# Patient Record
Sex: Female | Born: 1970
Health system: Southern US, Community
[De-identification: ages and names within clinical notes are randomized; demographics above are authoritative.]

## PROBLEM LIST (undated history)

## (undated) DIAGNOSIS — N926 Irregular menstruation, unspecified: Secondary | ICD-10-CM

## (undated) DIAGNOSIS — M549 Dorsalgia, unspecified: Secondary | ICD-10-CM

## (undated) DIAGNOSIS — E785 Hyperlipidemia, unspecified: Secondary | ICD-10-CM

## (undated) DIAGNOSIS — M1990 Unspecified osteoarthritis, unspecified site: Secondary | ICD-10-CM

## (undated) DIAGNOSIS — L74510 Primary focal hyperhidrosis, axilla: Secondary | ICD-10-CM

## (undated) DIAGNOSIS — D649 Anemia, unspecified: Secondary | ICD-10-CM

## (undated) DIAGNOSIS — G8929 Other chronic pain: Secondary | ICD-10-CM

## (undated) HISTORY — DX: Irregular menstruation, unspecified: N92.6

## (undated) HISTORY — PX: CHOLECYSTECTOMY: SHX55

## (undated) HISTORY — PX: TUBAL LIGATION: SHX77

## (undated) HISTORY — DX: Primary focal hyperhidrosis, axilla: L74.510

---
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
## 2002-04-19 ENCOUNTER — Emergency Department (HOSPITAL_COMMUNITY): Admission: EM | Admit: 2002-04-19 | Discharge: 2002-04-19 | Payer: Self-pay | Admitting: Emergency Medicine

## 2002-09-19 ENCOUNTER — Encounter: Payer: Self-pay | Admitting: Obstetrics and Gynecology

## 2002-09-19 ENCOUNTER — Ambulatory Visit (HOSPITAL_COMMUNITY): Admission: RE | Admit: 2002-09-19 | Discharge: 2002-09-19 | Payer: Self-pay | Admitting: Obstetrics and Gynecology

## 2003-03-09 ENCOUNTER — Inpatient Hospital Stay (HOSPITAL_COMMUNITY): Admission: AD | Admit: 2003-03-09 | Discharge: 2003-03-10 | Payer: Self-pay | Admitting: Family Medicine

## 2003-03-10 ENCOUNTER — Encounter: Payer: Self-pay | Admitting: Family Medicine

## 2004-01-29 ENCOUNTER — Ambulatory Visit (HOSPITAL_COMMUNITY): Admission: RE | Admit: 2004-01-29 | Discharge: 2004-01-29 | Payer: Self-pay | Admitting: Internal Medicine

## 2004-04-27 ENCOUNTER — Ambulatory Visit (HOSPITAL_COMMUNITY): Admission: RE | Admit: 2004-04-27 | Discharge: 2004-04-27 | Payer: Self-pay | Admitting: Family Medicine

## 2005-12-08 ENCOUNTER — Ambulatory Visit (HOSPITAL_COMMUNITY): Admission: RE | Admit: 2005-12-08 | Discharge: 2005-12-08 | Payer: Self-pay | Admitting: General Surgery

## 2006-09-19 ENCOUNTER — Emergency Department (HOSPITAL_COMMUNITY): Admission: EM | Admit: 2006-09-19 | Discharge: 2006-09-19 | Payer: Self-pay | Admitting: Emergency Medicine

## 2010-03-13 ENCOUNTER — Emergency Department (HOSPITAL_COMMUNITY): Admission: EM | Admit: 2010-03-13 | Discharge: 2010-03-13 | Payer: Self-pay | Admitting: Emergency Medicine

## 2010-03-14 ENCOUNTER — Ambulatory Visit (HOSPITAL_COMMUNITY): Admission: RE | Admit: 2010-03-14 | Discharge: 2010-03-14 | Payer: Self-pay | Admitting: Emergency Medicine

## 2010-07-27 LAB — URINALYSIS, ROUTINE W REFLEX MICROSCOPIC
Bilirubin Urine: NEGATIVE
Glucose, UA: NEGATIVE mg/dL
Hgb urine dipstick: NEGATIVE
Ketones, ur: NEGATIVE mg/dL
Nitrite: NEGATIVE
Protein, ur: NEGATIVE mg/dL
Specific Gravity, Urine: 1.02 (ref 1.005–1.030)
Urobilinogen, UA: 0.2 mg/dL (ref 0.0–1.0)
pH: 7.5 (ref 5.0–8.0)

## 2010-09-30 NOTE — H&P (Signed)
Raven Smith, Raven Smith               ACCOUNT NO.:  [id number]   MEDICAL RECORD NO.:  [id number]          PATIENT TYPE:  AMB   LOCATION:  DAY                           FACILITY:  [facility]   PHYSICIAN:  Dalia Heading, M.D.  DATE OF BIRTH:  04-Nov-1970   DATE OF ADMISSION:  DATE OF DISCHARGE:  LH                                HISTORY & PHYSICAL   CHIEF COMPLAINT:  Laceration, left ear lobe.   HISTORY OF PRESENT ILLNESS:  The patient is a 40 year old black female who  is referred for evaluation and treatment of a laceration in the left ear  lobe, an ear ring pulled through in the past and it has healed irregularly.  It is still separated.   PAST MEDICAL HISTORY:  Unremarkable.   PAST SURGICAL HISTORY:  Cholecystectomy, tubal ligation.   CURRENT MEDICATIONS:  None.   ALLERGIES:  No known drug allergies.   REVIEW OF SYSTEMS:  Noncontributory.   PHYSICAL EXAMINATION:  GENERAL:  The patient is a well-developed, well-  nourished, black female in no acute distress.  LUNGS:  Clear to auscultation  with equal breath sounds bilaterally.  HEART:  Regular rate and rhythm without S3, S4, or murmurs.  HEENT:  A torn left ear lobe.   IMPRESSION:  Laceration, left ear lobe.   PLAN:  The patient is scheduled for repair of the laceration, left ear lobe,  on December 06, 2005.  Risks and benefits of the procedure including bleeding  and infection were fully explained to the patient, gave informed consent.      Dalia Heading, M.D.  Electronically Signed    MAJ/MEDQ  D:  11/30/2005  T:  11/30/2005  Job:  [id number]   cc:   Mila Homer. Sudie Bailey, M.D.  Fax: [phone]

## 2010-09-30 NOTE — Op Note (Signed)
NAMEJYLL, TOMARO               ACCOUNT NO.:  [id number]   MEDICAL RECORD NO.:  [id number]          PATIENT TYPE:  AMB   LOCATION:  DAY                           FACILITY:  [facility]   PHYSICIAN:  Dalia Heading, M.D.  DATE OF BIRTH:  [birth year]   DATE OF PROCEDURE:  12/08/2005  DATE OF DISCHARGE:                                 OPERATIVE REPORT   PREOPERATIVE DIAGNOSIS:  Laceration, left ear.   POSTOPERATIVE DIAGNOSIS:  Laceration, left ear.   PROCEDURE:  Repair of laceration, left ear,   SURGEON:  Dalia Heading, M.D.   ANESTHESIA:  MAC.   INDICATIONS:  The patient is a 40 year old black female who suffered a  laceration to the left earlobe.  It has healed irregularly and is still  separated.  The patient now comes to the operating room for repair of the  laceration of the left ear lobe.  The risks and benefits of the procedure  were fully explained to the patient, who gave informed consent.   PROCEDURE NOTE:  The patient was placed in a supine position.  The left ear  was prepped and draped using the usual sterile technique with Betadine.  Surgical site confirmation was performed.   A full-thickness healed tear was noted in the left earlobe.  The inside  edges of this were excised.  Any bleeding was controlled using Bovie  electrocautery.  The edges were then reapproximated circumferentially using  a 5-0 nylon interrupted suture.  Xylocaine 1% was used throughout the  procedure for anesthesia.  Neosporin ointment was then applied.   All tape and counts were correct at the end of the procedure.  The patient  was administered to PACU in stable condition.   COMPLICATIONS:  None.   SPECIMEN:  None.   BLOOD LOSS:  Minimal.      Dalia Heading, M.D.  Electronically Signed     MAJ/MEDQ  D:  12/08/2005  T:  12/08/2005  Job:  [id number]   cc:   Mila Homer. Sudie Bailey, M.D.  Fax: [phone]

## 2010-12-15 DIAGNOSIS — Z01419 Encounter for gynecological examination (general) (routine) without abnormal findings: Secondary | ICD-10-CM | POA: Insufficient documentation

## 2010-12-15 DIAGNOSIS — Z113 Encounter for screening for infections with a predominantly sexual mode of transmission: Secondary | ICD-10-CM | POA: Insufficient documentation

## 2010-12-19 ENCOUNTER — Other Ambulatory Visit (HOSPITAL_COMMUNITY)
Admission: RE | Admit: 2010-12-19 | Discharge: 2010-12-19 | Disposition: A | Discharge status: Home / Self Care | Payer: Medicaid Other | Source: Ambulatory Visit | Attending: Obstetrics and Gynecology | Admitting: Obstetrics and Gynecology

## 2011-01-24 ENCOUNTER — Other Ambulatory Visit (HOSPITAL_COMMUNITY): Payer: Self-pay | Admitting: Family Medicine

## 2011-01-24 DIAGNOSIS — E049 Nontoxic goiter, unspecified: Secondary | ICD-10-CM

## 2011-01-24 DIAGNOSIS — R609 Edema, unspecified: Secondary | ICD-10-CM

## 2011-01-27 ENCOUNTER — Ambulatory Visit (HOSPITAL_COMMUNITY)
Admission: RE | Admit: 2011-01-27 | Discharge: 2011-01-27 | Disposition: A | Discharge status: Home / Self Care | Payer: Medicaid Other | Source: Ambulatory Visit | Attending: Family Medicine | Admitting: Family Medicine

## 2011-01-27 ENCOUNTER — Other Ambulatory Visit (HOSPITAL_COMMUNITY): Payer: Self-pay | Admitting: Family Medicine

## 2011-01-27 DIAGNOSIS — R609 Edema, unspecified: Secondary | ICD-10-CM

## 2011-01-27 DIAGNOSIS — E042 Nontoxic multinodular goiter: Secondary | ICD-10-CM | POA: Insufficient documentation

## 2011-01-27 DIAGNOSIS — M545 Low back pain, unspecified: Secondary | ICD-10-CM

## 2011-01-27 DIAGNOSIS — E049 Nontoxic goiter, unspecified: Secondary | ICD-10-CM

## 2011-03-13 ENCOUNTER — Other Ambulatory Visit (HOSPITAL_COMMUNITY): Payer: Self-pay | Admitting: Family Medicine

## 2011-03-13 DIAGNOSIS — R200 Anesthesia of skin: Secondary | ICD-10-CM

## 2011-03-17 ENCOUNTER — Other Ambulatory Visit: Payer: Self-pay | Admitting: Neurology

## 2011-03-17 DIAGNOSIS — R200 Anesthesia of skin: Secondary | ICD-10-CM

## 2011-03-20 ENCOUNTER — Ambulatory Visit (HOSPITAL_COMMUNITY): Payer: Medicaid Other

## 2011-03-30 ENCOUNTER — Ambulatory Visit (HOSPITAL_COMMUNITY): Payer: Medicaid Other

## 2011-03-31 ENCOUNTER — Other Ambulatory Visit (HOSPITAL_COMMUNITY): Payer: Medicaid Other

## 2011-04-10 ENCOUNTER — Ambulatory Visit (HOSPITAL_COMMUNITY): Admission: RE | Admit: 2011-04-10 | Payer: Medicaid Other | Source: Ambulatory Visit

## 2011-04-10 ENCOUNTER — Ambulatory Visit (HOSPITAL_COMMUNITY)
Admission: RE | Admit: 2011-04-10 | Discharge: 2011-04-10 | Disposition: A | Discharge status: Home / Self Care | Payer: Medicaid Other | Source: Ambulatory Visit | Attending: Neurology | Admitting: Neurology

## 2011-04-10 DIAGNOSIS — R209 Unspecified disturbances of skin sensation: Secondary | ICD-10-CM | POA: Insufficient documentation

## 2011-04-10 DIAGNOSIS — R200 Anesthesia of skin: Secondary | ICD-10-CM

## 2011-04-10 DIAGNOSIS — M51379 Other intervertebral disc degeneration, lumbosacral region without mention of lumbar back pain or lower extremity pain: Secondary | ICD-10-CM | POA: Insufficient documentation

## 2011-04-10 DIAGNOSIS — M542 Cervicalgia: Secondary | ICD-10-CM | POA: Insufficient documentation

## 2011-04-10 DIAGNOSIS — M5137 Other intervertebral disc degeneration, lumbosacral region: Secondary | ICD-10-CM | POA: Insufficient documentation

## 2011-04-10 DIAGNOSIS — M5126 Other intervertebral disc displacement, lumbar region: Secondary | ICD-10-CM | POA: Insufficient documentation

## 2012-05-15 DIAGNOSIS — E785 Hyperlipidemia, unspecified: Secondary | ICD-10-CM

## 2012-05-15 HISTORY — DX: Hyperlipidemia, unspecified: E78.5

## 2013-01-23 ENCOUNTER — Emergency Department (HOSPITAL_COMMUNITY): Payer: Medicaid Other

## 2013-01-23 ENCOUNTER — Emergency Department (HOSPITAL_COMMUNITY)
Admission: EM | Admit: 2013-01-23 | Discharge: 2013-01-23 | Disposition: A | Discharge status: Home / Self Care | Payer: Medicaid Other | Attending: Emergency Medicine | Admitting: Emergency Medicine

## 2013-01-23 ENCOUNTER — Encounter (HOSPITAL_COMMUNITY): Payer: Self-pay | Admitting: Emergency Medicine

## 2013-01-23 DIAGNOSIS — R1013 Epigastric pain: Secondary | ICD-10-CM | POA: Insufficient documentation

## 2013-01-23 DIAGNOSIS — Z79899 Other long term (current) drug therapy: Secondary | ICD-10-CM | POA: Insufficient documentation

## 2013-01-23 DIAGNOSIS — R0602 Shortness of breath: Secondary | ICD-10-CM | POA: Insufficient documentation

## 2013-01-23 DIAGNOSIS — M549 Dorsalgia, unspecified: Secondary | ICD-10-CM

## 2013-01-23 DIAGNOSIS — Z791 Long term (current) use of non-steroidal anti-inflammatories (NSAID): Secondary | ICD-10-CM | POA: Insufficient documentation

## 2013-01-23 DIAGNOSIS — Z9089 Acquired absence of other organs: Secondary | ICD-10-CM | POA: Insufficient documentation

## 2013-01-23 DIAGNOSIS — M129 Arthropathy, unspecified: Secondary | ICD-10-CM | POA: Insufficient documentation

## 2013-01-23 DIAGNOSIS — R42 Dizziness and giddiness: Secondary | ICD-10-CM | POA: Insufficient documentation

## 2013-01-23 DIAGNOSIS — G8929 Other chronic pain: Secondary | ICD-10-CM | POA: Insufficient documentation

## 2013-01-23 DIAGNOSIS — Z9851 Tubal ligation status: Secondary | ICD-10-CM | POA: Insufficient documentation

## 2013-01-23 DIAGNOSIS — R071 Chest pain on breathing: Secondary | ICD-10-CM | POA: Insufficient documentation

## 2013-01-23 DIAGNOSIS — R0789 Other chest pain: Secondary | ICD-10-CM

## 2013-01-23 HISTORY — DX: Dorsalgia, unspecified: M54.9

## 2013-01-23 HISTORY — DX: Unspecified osteoarthritis, unspecified site: M19.90

## 2013-01-23 HISTORY — DX: Other chronic pain: G89.29

## 2013-01-23 LAB — COMPREHENSIVE METABOLIC PANEL
ALT: 18 U/L (ref 0–35)
AST: 18 U/L (ref 0–37)
Albumin: 3.8 g/dL (ref 3.5–5.2)
Alkaline Phosphatase: 54 U/L (ref 39–117)
BUN: 6 mg/dL (ref 6–23)
CO2: 25 mEq/L (ref 19–32)
Calcium: 9.6 mg/dL (ref 8.4–10.5)
Chloride: 102 mEq/L (ref 96–112)
Creatinine, Ser: 0.74 mg/dL (ref 0.50–1.10)
GFR calc Af Amer: >90 mL/min (ref >90)
GFR calc non Af Amer: >90 mL/min (ref >90)
Glucose, Bld: 96 mg/dL (ref 70–99)
Potassium: 3.7 mEq/L (ref 3.5–5.1)
Sodium: 135 mEq/L (ref 135–145)
Total Bilirubin: 0.4 mg/dL (ref 0.3–1.2)
Total Protein: 7.7 g/dL (ref 6.0–8.3)

## 2013-01-23 LAB — CBC WITH DIFFERENTIAL/PLATELET
Basophils Absolute: 0 10*3/uL (ref 0.0–0.1)
Basophils Relative: 0 % (ref 0–1)
Eosinophils Absolute: 0 10*3/uL (ref 0.0–0.7)
Eosinophils Relative: 0 % (ref 0–5)
HCT: 35.6 % — ABNORMAL LOW (ref 36.0–46.0)
Hemoglobin: 11.9 g/dL — ABNORMAL LOW (ref 12.0–15.0)
Lymphocytes Relative: 27 % (ref 12–46)
Lymphs Abs: 1.5 10*3/uL (ref 0.7–4.0)
MCH: 26.7 pg (ref 26.0–34.0)
MCHC: 33.4 g/dL (ref 30.0–36.0)
MCV: 80 fL (ref 78.0–100.0)
Monocytes Absolute: 0.4 10*3/uL (ref 0.1–1.0)
Monocytes Relative: 6 % (ref 3–12)
Neutro Abs: 3.7 10*3/uL (ref 1.7–7.7)
Neutrophils Relative %: 66 % (ref 43–77)
Platelets: 200 10*3/uL (ref 150–400)
RBC: 4.45 MIL/uL (ref 3.87–5.11)
RDW: 13.6 % (ref 11.5–15.5)
WBC: 5.6 10*3/uL (ref 4.0–10.5)

## 2013-01-23 LAB — D-DIMER, QUANTITATIVE: D-Dimer, Quant: 0.48 ug/mL-FEU (ref 0.00–0.48)

## 2013-01-23 LAB — TROPONIN I: Troponin I: <0.3 ng/mL (ref <0.30)

## 2013-01-23 LAB — LIPASE, BLOOD: Lipase: 53 U/L (ref 11–59)

## 2013-01-23 MED ORDER — NAPROXEN 375 MG PO TABS: 375.0000 mg | ORAL_TABLET | Freq: Two times a day (BID) | ORAL | Status: DC

## 2013-01-23 MED ORDER — GI COCKTAIL ~~LOC~~
30.0000 mL | Freq: Once | ORAL | Status: AC
  Administered 2013-01-23: 30 mL via ORAL
  Filled 2013-01-23: qty 30

## 2013-01-23 MED ORDER — CYCLOBENZAPRINE HCL 10 MG PO TABS
10.0000 mg | ORAL_TABLET | Freq: Once | ORAL | Status: AC
  Administered 2013-01-23: 10 mg via ORAL
  Filled 2013-01-23: qty 1

## 2013-01-23 MED ORDER — HYDROCODONE-ACETAMINOPHEN 5-325 MG PO TABS: 1.0000 | ORAL_TABLET | ORAL | Status: DC | PRN

## 2013-01-23 MED ORDER — CYCLOBENZAPRINE HCL 10 MG PO TABS: 10.0000 mg | ORAL_TABLET | Freq: Two times a day (BID) | ORAL | Status: DC | PRN

## 2013-01-23 MED ORDER — OXYCODONE-ACETAMINOPHEN 5-325 MG PO TABS
1.0000 | ORAL_TABLET | Freq: Once | ORAL | Status: AC
  Administered 2013-01-23: 1 via ORAL
  Filled 2013-01-23: qty 1

## 2013-01-23 NOTE — Discharge Instructions (Signed)
Follow up with Dr. Sudie Bailey. Return here as needed. Do not take the pain medication or the muscle relaxant if driving as they will make you sleepy.

## 2013-01-23 NOTE — ED Notes (Signed)
Patient c/o mid-sternal chest pain that radiates into back. Per patient shortness of breath and lightheaded. Patient denies any cardiac hx. Per patient difficulty to turn head or raise arms.

## 2013-01-23 NOTE — ED Provider Notes (Signed)
CSN: [id number]     Arrival date & time 01/23/13  8295 History   First MD Initiated Contact with Patient 01/23/13 0825     Chief Complaint  Patient presents with  . Chest Pain  . Back Pain   (Consider location/radiation/quality/duration/timing/severity/associated sxs/prior Treatment) Patient is a 42 y.o. female presenting with chest pain. The history is provided by the patient.  Chest Pain Pain location:  Epigastric and R chest Pain radiates to the back: yes   Pain severity:  Moderate Onset quality:  Gradual Duration:  24 hours Timing:  Intermittent Progression:  Unchanged Chronicity:  New Relieved by:  Certain positions Worsened by:  Movement, deep breathing and coughing Associated symptoms: back pain and shortness of breath   Associated symptoms: no anorexia, no diaphoresis, no dizziness, no dysphagia, no fever, no headache, no nausea, no numbness, no palpitations, no syncope, not vomiting and no weakness    Raven Smith is a 42 y.o.female who presents to the ED with chest pain that is located in the mid-sternal area and radiates to to the back. She has felt short of breath and light headed. No cardiac history.   Past Medical History  Diagnosis Date  . Chronic back pain   . Arthritis    Past Surgical History  Procedure Laterality Date  . Cholecystectomy    . Tubal ligation     Family History  Problem Relation Age of Onset  . Heart failure Other   . Diabetes Other    History  Substance Use Topics  . Smoking status: Never Smoker   . Smokeless tobacco: Never Used  . Alcohol Use: Yes     Comment: occasional   OB History   Grav Para Term Preterm Abortions TAB SAB Ect Mult Living   3 2  2 1  1   2      Review of Systems  Constitutional: Negative for fever and diaphoresis.  HENT: Negative for congestion and trouble swallowing.   Respiratory: Positive for shortness of breath.   Cardiovascular: Positive for chest pain. Negative for palpitations and syncope.    Gastrointestinal: Negative for nausea, vomiting and anorexia.  Musculoskeletal: Positive for back pain.  Skin: Negative for rash.  Neurological: Positive for light-headedness. Negative for dizziness, weakness, numbness and headaches.  Psychiatric/Behavioral: The patient is not nervous/anxious.     Allergies  Review of patient's allergies indicates no known allergies.  Home Medications   Current Outpatient Rx  Name  Route  Sig  Dispense  Refill  . acetaminophen (TYLENOL) 325 MG tablet   Oral   Take 650 mg by mouth every 6 (six) hours as needed for pain.         . Cholecalciferol (VITAMIN D PO)   Oral   Take 1 tablet by mouth once a week.         . gabapentin (NEURONTIN) 300 MG capsule   Oral   Take 300 mg by mouth 2 (two) times daily.         . meloxicam (MOBIC) 7.5 MG tablet   Oral   Take 7.5 mg by mouth daily.         Marland Kitchen omeprazole (PRILOSEC) 10 MG capsule   Oral   Take 10 mg by mouth daily as needed (acid reflex).          BP 117/73  Pulse 82  Temp(Src) 97.9 F (36.6 C) (Oral)  Resp 20  Ht 5\' 9"  (1.753 m)  Wt 158 lb (71.668 kg)  BMI  23.32 kg/m2  SpO2 100%  LMP 01/06/2013 Physical Exam  Nursing note and vitals reviewed. Constitutional: She is oriented to person, place, and time. She appears well-developed and well-nourished.  HENT:  Head: Normocephalic and atraumatic.  Eyes: Conjunctivae and EOM are normal. Pupils are equal, round, and reactive to light.  Neck: Trachea normal. Neck supple. Normal carotid pulses and no JVD present. Muscular tenderness present. Carotid bruit is not present.    Muscular tenderness right side of neck with range of motion.   Cardiovascular: Normal rate, regular rhythm, normal heart sounds and intact distal pulses.   Pulmonary/Chest: Effort normal and breath sounds normal. She exhibits tenderness.    Abdominal: Soft. Bowel sounds are normal. She exhibits no mass. There is tenderness in the epigastric area. There is no  rigidity, no rebound, no guarding and no CVA tenderness.  Musculoskeletal: She exhibits no edema.       Lumbar back: She exhibits decreased range of motion and tenderness.       Back:  Neurological: She is alert and oriented to person, place, and time. She has normal strength. No cranial nerve deficit or sensory deficit. Gait normal.  Skin: Skin is warm and dry.  Psychiatric: She has a normal mood and affect. Her behavior is normal.    ED Course: Dr. Estell Harpin in to see the patient.  Procedures  Results for orders placed during the hospital encounter of 01/23/13 (from the past 24 hour(s))  CBC WITH DIFFERENTIAL     Status: Abnormal   Collection Time    01/23/13  8:53 AM      Result Value Range   WBC 5.6  4.0 - 10.5 K/uL   RBC 4.45  3.87 - 5.11 MIL/uL   Hemoglobin 11.9 (*) 12.0 - 15.0 g/dL   HCT 16.1 (*) 09.6 - 04.5 %   MCV 80.0  78.0 - 100.0 fL   MCH 26.7  26.0 - 34.0 pg   MCHC 33.4  30.0 - 36.0 g/dL   RDW 40.9  81.1 - 91.4 %   Platelets 200  150 - 400 K/uL   Neutrophils Relative % 66  43 - 77 %   Neutro Abs 3.7  1.7 - 7.7 K/uL   Lymphocytes Relative 27  12 - 46 %   Lymphs Abs 1.5  0.7 - 4.0 K/uL   Monocytes Relative 6  3 - 12 %   Monocytes Absolute 0.4  0.1 - 1.0 K/uL   Eosinophils Relative 0  0 - 5 %   Eosinophils Absolute 0.0  0.0 - 0.7 K/uL   Basophils Relative 0  0 - 1 %   Basophils Absolute 0.0  0.0 - 0.1 K/uL  COMPREHENSIVE METABOLIC PANEL     Status: None   Collection Time    01/23/13  8:53 AM      Result Value Range   Sodium 135  135 - 145 mEq/L   Potassium 3.7  3.5 - 5.1 mEq/L   Chloride 102  96 - 112 mEq/L   CO2 25  19 - 32 mEq/L   Glucose, Bld 96  70 - 99 mg/dL   BUN 6  6 - 23 mg/dL   Creatinine, Ser 7.82  0.50 - 1.10 mg/dL   Calcium 9.6  8.4 - 95.6 mg/dL   Total Protein 7.7  6.0 - 8.3 g/dL   Albumin 3.8  3.5 - 5.2 g/dL   AST 18  0 - 37 U/L   ALT 18  0 - 35 U/L  Alkaline Phosphatase 54  39 - 117 U/L   Total Bilirubin 0.4  0.3 - 1.2 mg/dL   GFR calc non  Af Amer >90  >90 mL/min   GFR calc Af Amer >90  >90 mL/min  TROPONIN I     Status: None   Collection Time    01/23/13  8:53 AM      Result Value Range   Troponin I <0.30  <0.30 ng/mL  D-DIMER, QUANTITATIVE     Status: None   Collection Time    01/23/13 10:24 AM      Result Value Range   D-Dimer, Quant 0.48  0.00 - 0.48 ug/mL-FEU  LIPASE, BLOOD     Status: None   Collection Time    01/23/13 10:24 AM      Result Value Range   Lipase 53  11 - 59 U/L    Dg Chest 2 View  01/23/2013   CLINICAL DATA:  Back pain, chest pain, and shortness of breath since last night  EXAM: CHEST  2 VIEW  COMPARISON:  None  FINDINGS: Normal heart size, mediastinal contours, and pulmonary vascularity.  Lungs hyperinflated but clear.  No pleural effusion or pneumothorax.  No acute osseous findings.  IMPRESSION: Hyperinflated lungs without infiltrate.   Electronically Signed   By: Ulyses Southward M.D.   On: 01/23/2013 09:15    MDM: Dr. Estell Harpin in to examine the patient.  EKG: Rate 84 bpm normal sinus rhythm reviewed by Dr. Estell Harpin, labs and chest x-ray within normal limits  42 y.o. female with chest and back pain that increases with movement  Re evaluation prior to discharge. Patient feeling better after pain medication and muscle relaxant. Minimal pain. Will d/c home with medication and she will follow up with her PCP.  I have reviewed this patient's vital signs, nurses notes, appropriate labs and imaging.  I have discussed findings and plan of care in detail with the patient. Patient voices understanding. She will return for any problems.     Medication List    STOP taking these medications       meloxicam 7.5 MG tablet  Commonly known as:  MOBIC      TAKE these medications       cyclobenzaprine 10 MG tablet  Commonly known as:  FLEXERIL  Take 1 tablet (10 mg total) by mouth 2 (two) times daily as needed for muscle spasms.     HYDROcodone-acetaminophen 5-325 MG per tablet  Commonly known as:  NORCO/VICODIN   Take 1 tablet by mouth every 4 (four) hours as needed.     naproxen 375 MG tablet  Commonly known as:  NAPROSYN  Take 1 tablet (375 mg total) by mouth 2 (two) times daily.      ASK your doctor about these medications       acetaminophen 325 MG tablet  Commonly known as:  TYLENOL  Take 650 mg by mouth every 6 (six) hours as needed for pain.     gabapentin 300 MG capsule  Commonly known as:  NEURONTIN  Take 300 mg by mouth 2 (two) times daily.     omeprazole 10 MG capsule  Commonly known as:  PRILOSEC  Take 10 mg by mouth daily as needed (acid reflex).     VITAMIN D PO  Take 1 tablet by mouth once a week.         [address] [place], [place] 01/24/13 [phone]

## 2013-01-24 NOTE — ED Provider Notes (Signed)
Medical screening examination/treatment/procedure(s) were performed by non-physician practitioner and as supervising physician I was immediately available for consultation/collaboration.   Benny Lennert, MD 01/24/13 [phone]

## 2013-01-31 ENCOUNTER — Emergency Department (HOSPITAL_COMMUNITY)
Admission: EM | Admit: 2013-01-31 | Discharge: 2013-01-31 | Disposition: A | Discharge status: Home / Self Care | Payer: Medicaid Other | Attending: Emergency Medicine | Admitting: Emergency Medicine

## 2013-01-31 ENCOUNTER — Encounter (HOSPITAL_COMMUNITY): Payer: Self-pay | Admitting: *Deleted

## 2013-01-31 DIAGNOSIS — Z79899 Other long term (current) drug therapy: Secondary | ICD-10-CM | POA: Insufficient documentation

## 2013-01-31 DIAGNOSIS — X12XXXA Contact with other hot fluids, initial encounter: Secondary | ICD-10-CM | POA: Insufficient documentation

## 2013-01-31 DIAGNOSIS — T23179A Burn of first degree of unspecified wrist, initial encounter: Secondary | ICD-10-CM | POA: Insufficient documentation

## 2013-01-31 DIAGNOSIS — G8929 Other chronic pain: Secondary | ICD-10-CM | POA: Insufficient documentation

## 2013-01-31 DIAGNOSIS — Y9389 Activity, other specified: Secondary | ICD-10-CM | POA: Insufficient documentation

## 2013-01-31 DIAGNOSIS — M129 Arthropathy, unspecified: Secondary | ICD-10-CM | POA: Insufficient documentation

## 2013-01-31 DIAGNOSIS — Z791 Long term (current) use of non-steroidal anti-inflammatories (NSAID): Secondary | ICD-10-CM | POA: Insufficient documentation

## 2013-01-31 DIAGNOSIS — T23171A Burn of first degree of right wrist, initial encounter: Secondary | ICD-10-CM

## 2013-01-31 DIAGNOSIS — Y9289 Other specified places as the place of occurrence of the external cause: Secondary | ICD-10-CM | POA: Insufficient documentation

## 2013-01-31 MED ORDER — SILVER SULFADIAZINE 1 % EX CREA
TOPICAL_CREAM | CUTANEOUS | Status: AC
  Filled 2013-01-31: qty 50

## 2013-01-31 MED ORDER — HYDROCODONE-ACETAMINOPHEN 5-325 MG PO TABS: 1.0000 | ORAL_TABLET | ORAL | Status: DC | PRN

## 2013-01-31 MED ORDER — SILVER SULFADIAZINE 1 % EX CREA
TOPICAL_CREAM | Freq: Two times a day (BID) | CUTANEOUS | Status: DC
  Administered 2013-01-31: 19:00:00 via TOPICAL

## 2013-01-31 NOTE — ED Provider Notes (Signed)
CSN: [id number]     Arrival date & time 01/31/13  1835 History   First MD Initiated Contact with Patient 01/31/13 1906     No chief complaint on file.  (Consider location/radiation/quality/duration/timing/severity/associated sxs/prior Treatment) HPI Comments: Patient here with right wrist burn with two separate blisters intact after spilling hot coffee on herself this morning - reports pain surrounding the burned areas as well - tetanus UTD  Patient is a 42 y.o. female presenting with burn. The history is provided by the patient. No language interpreter was used.  Burn Burn location:  Shoulder/arm Shoulder/arm burn location:  R wrist Burn quality:  Intact blister and painful Time since incident:  8 hours Progression:  Worsening Pain details:    Severity:  Moderate   Timing:  Constant   Progression:  Worsening Mechanism of burn:  Hot liquid Incident location:  Work Relieved by:  Nothing Worsened by:  Nothing tried Ineffective treatments:  None tried Associated symptoms: no cough, no difficulty swallowing, no eye pain, no nasal burns and no shortness of breath   Tetanus status:  Up to date   Past Medical History  Diagnosis Date  . Chronic back pain   . Arthritis    Past Surgical History  Procedure Laterality Date  . Cholecystectomy    . Tubal ligation     Family History  Problem Relation Age of Onset  . Heart failure Other   . Diabetes Other    History  Substance Use Topics  . Smoking status: Never Smoker   . Smokeless tobacco: Never Used  . Alcohol Use: Yes     Comment: occasional   OB History   Grav Para Term Preterm Abortions TAB SAB Ect Mult Living   3 2  2 1  1   2      Review of Systems  HENT: Negative for trouble swallowing.   Eyes: Negative for pain.  Respiratory: Negative for cough and shortness of breath.   Skin: Positive for color change and wound.  All other systems reviewed and are negative.    Allergies  Review of patient's allergies  indicates no known allergies.  Home Medications   Current Outpatient Rx  Name  Route  Sig  Dispense  Refill  . acetaminophen (TYLENOL) 325 MG tablet   Oral   Take 650 mg by mouth every 6 (six) hours as needed for pain.         . Cholecalciferol (VITAMIN D PO)   Oral   Take 1 tablet by mouth once a week.         . cyclobenzaprine (FLEXERIL) 10 MG tablet   Oral   Take 1 tablet (10 mg total) by mouth 2 (two) times daily as needed for muscle spasms.   20 tablet   0   . gabapentin (NEURONTIN) 300 MG capsule   Oral   Take 300 mg by mouth 2 (two) times daily.         Marland Kitchen HYDROcodone-acetaminophen (NORCO/VICODIN) 5-325 MG per tablet   Oral   Take 1 tablet by mouth every 4 (four) hours as needed.   15 tablet   0   . naproxen (NAPROSYN) 375 MG tablet   Oral   Take 1 tablet (375 mg total) by mouth 2 (two) times daily.   20 tablet   0   . omeprazole (PRILOSEC) 10 MG capsule   Oral   Take 10 mg by mouth daily as needed (acid reflex).  BP 116/77  Pulse 97  Temp(Src) 97.2 F (36.2 C) (Oral)  Resp 20  Ht 5\' 9"  (1.753 m)  Wt 161 lb (73.029 kg)  BMI 23.76 kg/m2  SpO2 100%  LMP 01/31/2013 Physical Exam  Nursing note and vitals reviewed. Constitutional: She is oriented to person, place, and time. She appears well-developed and well-nourished. No distress.  HENT:  Head: Normocephalic and atraumatic.  Mouth/Throat: Oropharynx is clear and moist.  Eyes: Conjunctivae are normal. No scleral icterus.  Musculoskeletal: Normal range of motion. She exhibits no edema and no tenderness.  Neurological: She is alert and oriented to person, place, and time. No cranial nerve deficit.  Skin: Skin is warm and dry. There is erythema.  Right distal wrist with two separate 0.5 cm blisters with skin intact.  Psychiatric: She has a normal mood and affect. Her behavior is normal. Judgment and thought content normal.    ED Course  Procedures (including critical care time) Labs  Review Labs Reviewed - No data to display Imaging Review No results found.  MDM  Burn  Patient with mild partial thickness burn to right wrist - given silvadene cream and instructed on burn care - no need to burst the blisters at this time.   Izola Price Marisue Humble, PA-C 01/31/13 1920

## 2013-01-31 NOTE — Discharge Instructions (Signed)

## 2013-01-31 NOTE — ED Notes (Addendum)
Burn to rt wrist on hot coffee this am  Blister present

## 2013-01-31 NOTE — ED Provider Notes (Signed)
Medical screening examination/treatment/procedure(s) were performed by non-physician practitioner and as supervising physician I was immediately available for consultation/collaboration.  Donnetta Hutching, MD 01/31/13 2028

## 2013-04-23 ENCOUNTER — Encounter: Payer: Self-pay | Admitting: Adult Health

## 2013-04-23 ENCOUNTER — Ambulatory Visit (INDEPENDENT_AMBULATORY_CARE_PROVIDER_SITE_OTHER): Payer: Medicaid Other | Admitting: Adult Health

## 2013-04-23 ENCOUNTER — Other Ambulatory Visit (HOSPITAL_COMMUNITY)
Admission: RE | Admit: 2013-04-23 | Discharge: 2013-04-23 | Disposition: A | Discharge status: Home / Self Care | Payer: Medicaid Other | Source: Ambulatory Visit | Attending: Adult Health | Admitting: Adult Health

## 2013-04-23 VITALS — BP 128/80 | HR 72 | Ht 69.0 in | Wt 163.0 lb

## 2013-04-23 DIAGNOSIS — N926 Irregular menstruation, unspecified: Secondary | ICD-10-CM

## 2013-04-23 DIAGNOSIS — L74510 Primary focal hyperhidrosis, axilla: Secondary | ICD-10-CM | POA: Insufficient documentation

## 2013-04-23 DIAGNOSIS — Z0000 Encounter for general adult medical examination without abnormal findings: Secondary | ICD-10-CM

## 2013-04-23 DIAGNOSIS — Z1212 Encounter for screening for malignant neoplasm of rectum: Secondary | ICD-10-CM

## 2013-04-23 DIAGNOSIS — Z01419 Encounter for gynecological examination (general) (routine) without abnormal findings: Secondary | ICD-10-CM | POA: Insufficient documentation

## 2013-04-23 DIAGNOSIS — Z1151 Encounter for screening for human papillomavirus (HPV): Secondary | ICD-10-CM | POA: Insufficient documentation

## 2013-04-23 HISTORY — DX: Primary focal hyperhidrosis, axilla: L74.510

## 2013-04-23 HISTORY — DX: Irregular menstruation, unspecified: N92.6

## 2013-04-23 LAB — HEMOCCULT GUIAC POC 1CARD (OFFICE): Fecal Occult Blood, POC: NEGATIVE

## 2013-04-23 LAB — CBC
HCT: 34.4 % — ABNORMAL LOW (ref 36.0–46.0)
Hemoglobin: 11.7 g/dL — ABNORMAL LOW (ref 12.0–15.0)
MCH: 26.4 pg (ref 26.0–34.0)
MCHC: 34 g/dL (ref 30.0–36.0)
MCV: 77.7 fL — ABNORMAL LOW (ref 78.0–100.0)
Platelets: 214 10*3/uL (ref 150–400)
RBC: 4.43 MIL/uL (ref 3.87–5.11)
RDW: 14.8 % (ref 11.5–15.5)
WBC: 6 10*3/uL (ref 4.0–10.5)

## 2013-04-23 MED ORDER — ALUMINUM CHLORIDE 20 % EX SOLN: Freq: Every day | CUTANEOUS | Status: DC

## 2013-04-23 NOTE — Progress Notes (Signed)
Patient ID: Raven Smith, female   DOB: [birth year], 42 y.o.   MRN: [id number] History of Present Illness: Raven Smith is a 42 year old black female, in for a pap and physical. She is complaining of sweating a lot under her arms and irregular bleeding and has some pain in right side at times. She says she has swelling above clavicles at times.Sex like it used to be he has issues.  Current Medications, Allergies, Past Medical History, Past Surgical History, Family History and Social History were reviewed in [employer] record.     Review of Systems: Patient denies any headaches, blurred vision, shortness of breath, chest pain, problems with bowel movements, urination, or intercourse. No joint pain or mood changes.Positives in HPI.    Physical Exam:BP 128/80  Pulse 72  Ht 5\' 9"  (1.753 m)  Wt 163 lb (73.936 kg)  BMI 24.06 kg/m2  LMP 04/16/2013 General:  Well developed, well nourished, no acute distress Skin:  Warm and dry Neck:  Midline trachea, normal thyroid, no swelling today Lungs; Clear to auscultation bilaterally Breast:  No dominant palpable mass, retraction, or nipple discharge Cardiovascular: Regular rate and rhythm Abdomen:  Soft, non tender, no hepatosplenomegaly Pelvic:  External genitalia is normal in appearance.  The vagina is normal in appearance.  The cervix is bulbous.pap with HPV performed.  Uterus is felt to be normal size, shape, and contour.  No   adnexal masses or tenderness noted. Rectal: Good sphincter tone, no polyps, or hemorrhoids felt.  Hemoccult negative. Extremities:  No swelling or varicosities noted Psych:  No mood changes, alert and cooperative,seems happy   Impression: Yearly gyn exam irregular bleeding Hyperhidrosis     Plan: Check CBC,CMP,TSh and lipids Return in 1 week for [place] and see me Mammogram now and yearly Rx Drysol use at hs no refills Call with swelling to check out

## 2013-04-23 NOTE — Patient Instructions (Addendum)
Physical in 1 year Mammogram now and yearly [phone] Follow up labs Return in 1 week for [place] Underarm, Excessive Sweating The medical name for excessive sweating under the arms is primary axillary hyperhidrosis. This condition can interfere with regular social interaction. It can have emotional and professional consequences for some people. Botulinum Toxin Type A (Botox) is an approved treatment for severe underarm sweating. This is used only when the problem can not be managed by prescription antiperspirants. Botox is approved for several other purposes. Botox has also been shown to help with excessive sweating of the palms. BEFORE THE PROCEDURE  Before being treated for excessive sweating, patients should be checked for other possible causes of the problem. This avoids treating a symptom which could be caused by some other serious disease that needs a different treatment. PROCEDURE  Botox is a protein produced by the germ (bacterium) Clostridium botulinum. Small doses are injected under the arms to control sweating. These injections stop the release of acetylcholine. Acetylcholine is a Engineer, manufacturing which makes you sweat. These injections temporarily block the nerves in the underarm from producing this messenger. The reduction in sweating may last for an average of 7 months and up to 16 months after one injection. RISK AND COMPLICATIONS The most common bad reactions are:  Injection site pain and bleeding.  Sweating in other parts of the body.  Flu-like symptoms.  Headache.  Fever.  Itching.  Anxiety. Rarely, it can cause a generalized paralysis.  The safety and effectiveness of Botox for excessive sweating in other body areas is not known. Botox is a prescription drug. It must be used carefully under medical supervision. It should be used only for approved indications.  Document Released: 06/30/2005 Document Revised: 07/24/2011 Document Reviewed: 05/06/2008 [facility] Patient  Information 2014 [place], [place].

## 2013-04-24 LAB — COMPREHENSIVE METABOLIC PANEL
ALT: 14 U/L (ref 0–35)
AST: 13 U/L (ref 0–37)
Albumin: 4.2 g/dL (ref 3.5–5.2)
Alkaline Phosphatase: 60 U/L (ref 39–117)
BUN: 11 mg/dL (ref 6–23)
CO2: 28 mEq/L (ref 19–32)
Calcium: 9.5 mg/dL (ref 8.4–10.5)
Chloride: 99 mEq/L (ref 96–112)
Creat: 0.66 mg/dL (ref 0.50–1.10)
Glucose, Bld: 61 mg/dL — ABNORMAL LOW (ref 70–99)
Potassium: 4.5 mEq/L (ref 3.5–5.3)
Sodium: 135 mEq/L (ref 135–145)
Total Bilirubin: 0.4 mg/dL (ref 0.3–1.2)
Total Protein: 7.3 g/dL (ref 6.0–8.3)

## 2013-04-24 LAB — LIPID PANEL
Cholesterol: 237 mg/dL — ABNORMAL HIGH (ref 0–200)
HDL: 48 mg/dL (ref >39)
LDL Cholesterol: 144 mg/dL — ABNORMAL HIGH (ref 0–99)
Total CHOL/HDL Ratio: 4.9 Ratio
Triglycerides: 227 mg/dL — ABNORMAL HIGH (ref <150)
VLDL: 45 mg/dL — ABNORMAL HIGH (ref 0–40)

## 2013-04-24 LAB — TSH: TSH: 0.824 u[IU]/mL (ref 0.350–4.500)

## 2013-04-25 ENCOUNTER — Telehealth: Payer: Self-pay | Admitting: Adult Health

## 2013-04-25 MED ORDER — SIMVASTATIN 20 MG PO TABS: ORAL_TABLET | ORAL | Status: DC

## 2013-04-25 NOTE — Telephone Encounter (Signed)
Pt aware of labs, take MV with Fe and rx zocor 20 mg recheck labs in 8 weeks, watch diet and exercise

## 2013-05-01 ENCOUNTER — Ambulatory Visit: Payer: Medicaid Other | Admitting: Adult Health

## 2013-05-01 ENCOUNTER — Other Ambulatory Visit: Payer: Medicaid Other

## 2013-05-02 ENCOUNTER — Ambulatory Visit (INDEPENDENT_AMBULATORY_CARE_PROVIDER_SITE_OTHER): Payer: Medicaid Other | Admitting: Adult Health

## 2013-05-02 ENCOUNTER — Encounter: Payer: Self-pay | Admitting: Adult Health

## 2013-05-02 ENCOUNTER — Ambulatory Visit (INDEPENDENT_AMBULATORY_CARE_PROVIDER_SITE_OTHER): Payer: Medicaid Other

## 2013-05-02 VITALS — BP 120/80 | Ht 69.0 in | Wt 159.0 lb

## 2013-05-02 DIAGNOSIS — N83209 Unspecified ovarian cyst, unspecified side: Secondary | ICD-10-CM

## 2013-05-02 DIAGNOSIS — N926 Irregular menstruation, unspecified: Secondary | ICD-10-CM

## 2013-05-02 NOTE — Patient Instructions (Signed)
Endometrial Ablation Endometrial ablation removes the lining of the uterus (endometrium). It is usually a same-day, outpatient treatment. Ablation helps avoid major surgery, such as surgery to remove the cervix and uterus (hysterectomy). After endometrial ablation, you will have little or no menstrual bleeding and may not be able to have children. However, if you are premenopausal, you will need to use a reliable method of birth control following the procedure because of the small chance that pregnancy can occur. There are different reasons to have this procedure, which include:  Heavy periods.  Bleeding that is causing anemia.  Irregular bleeding.  Bleeding fibroids on the lining inside the uterus if they are smaller than 3 centimeters. This procedure should not be done if:  You want children in the future.  You have severe cramps with your menstrual period.  You have precancerous or cancerous cells in your uterus.  You were recently pregnant.  You have gone through menopause.  You have had major surgery on the uterus, such as a cesarean delivery. LET [facility] CARE PROVIDER KNOW ABOUT:  Any allergies you have.  All medicines you are taking, including vitamins, herbs, eye drops, creams, and over-the-counter medicines.  Previous problems you or members of your family have had with the use of anesthetics.  Any blood disorders you have.  Previous surgeries you have had.  Medical conditions you have. RISKS AND COMPLICATIONS  Generally, this is a safe procedure. However, as with any procedure, complications can occur. Possible complications include:  Perforation of the uterus.  Bleeding.  Infection of the uterus, bladder, or vagina.  Injury to surrounding organs.  An air bubble to the lung (air embolus).  Pregnancy following the procedure.  Failure of the procedure to help the problem, requiring hysterectomy.  Decreased ability to diagnose cancer in the lining of  the uterus. BEFORE THE PROCEDURE  The lining of the uterus must be tested to make sure there is no pre-cancerous or cancer cells present.  An ultrasound may be performed to look at the size of the uterus and to check for abnormalities.  Medicines may be given to thin the lining of the uterus. PROCEDURE  During the procedure, your health care provider will use a tool called a resectoscope to help see inside your uterus. There are different ways to remove the lining of your uterus.   Radiofrequency  This method uses a radiofrequency-alternating electric current to remove the lining of the uterus.  Cryotherapy This method uses extreme cold to freeze the lining of the uterus.  Heated-Free Liquid  This method uses heated salt (saline) solution to remove the lining of the uterus.  Microwave This method uses high-energy microwaves to heat up the lining of the uterus to remove it.  Thermal balloon  This method involves inserting a catheter with a balloon tip into the uterus. The balloon tip is filled with heated fluid to remove the lining of the uterus. AFTER THE PROCEDURE  After your procedure, do not have sexual intercourse or insert anything into your vagina until permitted by your health care provider. After the procedure, you may experience:  Cramps.  Vaginal discharge.  Frequent urination. Document Released: 03/10/2004 Document Revised: 01/01/2013 Document Reviewed: 10/02/2012 [facility] Patient Information 2014 [place], [place]. Follow up prn Ovarian Cyst The ovaries are small organs that are on each side of the uterus. The ovaries are the organs that produce the female hormones, estrogen and progesterone. An ovarian cyst is a sac filled with fluid that can vary in  its size. It is normal for a small cyst to form in women who are in the childbearing age and who have menstrual periods. This type of cyst is called a follicle cyst that becomes an ovulation cyst (corpus luteum cyst) after it  produces the women's egg. It later goes away on its own if the woman does not become pregnant. There are other kinds of ovarian cysts that may cause problems and may need to be treated. The most serious problem is a cyst with cancer. It should be noted that menopausal women who have an ovarian cyst are at a higher risk of it being a cancer cyst. They should be evaluated very quickly, thoroughly and followed closely. This is especially true in menopausal women because of the high rate of ovarian cancer in women in menopause. CAUSES AND TYPES OF OVARIAN CYSTS: FUNCTIONAL CYST: The follicle/corpus luteum cyst is a functional cyst that occurs every month during ovulation with the menstrual cycle. They go away with the next menstrual cycle if the woman does not get pregnant. Usually, there are no symptoms with a functional cyst. ENDOMETRIOMA CYST: This cyst develops from the lining of the uterus tissue. This cyst gets in or on the ovary. It grows every month from the bleeding during the menstrual period. It is also called a "chocolate cyst" because it becomes filled with blood that turns brown. This cyst can cause pain in the lower abdomen during intercourse and with your menstrual period. CYSTADENOMA CYST: This cyst develops from the cells on the outside of the ovary. They usually are not cancerous. They can get very big and cause lower abdomen pain and pain with intercourse. This type of cyst can twist on itself, cut off its blood supply and cause severe pain. It also can easily rupture and cause a lot of pain. DERMOID CYST: This type of cyst is sometimes found in both ovaries. They are found to have different kinds of body tissue in the cyst. The tissue includes skin, teeth, hair, and/or cartilage. They usually do not have symptoms unless they get very big. Dermoid cysts are rarely cancerous. POLYCYSTIC OVARY: This is a rare condition with hormone problems that produces many small cysts on both ovaries. The  cysts are follicle-like cysts that never produce an egg and become a corpus luteum. It can cause an increase in body weight, infertility, acne, increase in body and facial hair and lack of menstrual periods or rare menstrual periods. Many women with this problem develop type 2 diabetes. The exact cause of this problem is unknown. A polycystic ovary is rarely cancerous. THECA LUTEIN CYST: Occurs when too much hormone (human chorionic gonadotropin) is produced and over-stimulates the ovaries to produce an egg. They are frequently seen when doctors stimulate the ovaries for invitro-fertilization (test tube babies). LUTEOMA CYST: This cyst is seen during pregnancy. Rarely it can cause an obstruction to the birth canal during labor and delivery. They usually go away after delivery. SYMPTOMS  Pelvic pain or pressure. Pain during sexual intercourse. Increasing girth (swelling) of the abdomen. Abnormal menstrual periods. Increasing pain with menstrual periods. You stop having menstrual periods and you are not pregnant. DIAGNOSIS  The diagnosis can be made during: Routine or annual pelvic examination (common). Ultrasound. X-ray of the pelvis. CT Scan. MRI. Blood tests. TREATMENT  Treatment may only be to follow the cyst monthly for 2 to 3 months with your caregiver. Many go away on their own, especially functional cysts. May be aspirated (drained) with a  long needle with ultrasound, or by laparoscopy (inserting a tube into the pelvis through a small incision). The whole cyst can be removed by laparoscopy. Sometimes the cyst may need to be removed through an incision in the lower abdomen. Hormone treatment is sometimes used to help dissolve certain cysts. Birth control pills are sometimes used to help dissolve certain cysts. HOME CARE INSTRUCTIONS  Follow your caregiver's advice regarding: Medicine. Follow up visits to evaluate and treat the cyst. You may need to come back or make an appointment  with another caregiver, to find the exact cause of your cyst, if your caregiver is not a gynecologist. Get your yearly and recommended pelvic examinations and Pap tests. Let your caregiver know if you have had an ovarian cyst in the past. SEEK MEDICAL CARE IF:  Your periods are late, irregular, they stop, or are painful. Your stomach (abdomen) or pelvic pain does not go away. Your stomach becomes larger or swollen. You have pressure on your bladder or trouble emptying your bladder completely. You have painful sexual intercourse. You have feelings of fullness, pressure, or discomfort in your stomach. You lose weight for no apparent reason. You feel generally ill. You become constipated. You lose your appetite. You develop acne. You have an increase in body and facial hair. You are gaining weight, without changing your exercise and eating habits. You think you are pregnant. SEEK IMMEDIATE MEDICAL CARE IF:  You have increasing abdominal pain. You feel sick to your stomach (nausea) and/or vomit. You develop a fever that comes on suddenly. You develop abdominal pain during a bowel movement. Your menstrual periods become heavier than usual. Document Released: 05/01/2005 Document Revised: 07/24/2011 Document Reviewed: 03/04/2009 [facility] Patient Information 2014 [place], [place].

## 2013-05-02 NOTE — Progress Notes (Signed)
Subjective:     Patient ID: Raven Smith, female   DOB: 08-09-1970, 42 y.o.   MRN: [id number]  HPI Raven Smith is in for [place] for irregular bleeding.  Review of Systems See HPI Reviewed past medical,surgical, social and family history. Reviewed medications and allergies.     Objective:   Physical Exam BP 120/80  Ht 5\' 9"  (1.753 m)  Wt 159 lb (72.122 kg)  BMI 23.47 kg/m2  LMP 04/16/2013   Reviewed [place] with pt has simple left ovarian cyst, 9.3 endometrium, no masses seen Discussed ablation,OCs and watching Assessment:     Irregular bleeding Left simple ovarian cyst    Plan:     Follow up prn Review handouts on ablation and ovarian cyst

## 2014-03-16 ENCOUNTER — Encounter: Payer: Self-pay | Admitting: Adult Health

## 2014-06-22 ENCOUNTER — Ambulatory Visit (HOSPITAL_COMMUNITY)
Admission: RE | Admit: 2014-06-22 | Discharge: 2014-06-22 | Disposition: A | Discharge status: Home / Self Care | Payer: Medicaid Other | Source: Ambulatory Visit | Attending: Family Medicine | Admitting: Family Medicine

## 2014-06-22 ENCOUNTER — Other Ambulatory Visit (HOSPITAL_COMMUNITY): Payer: Self-pay | Admitting: Family Medicine

## 2014-06-22 ENCOUNTER — Other Ambulatory Visit (HOSPITAL_COMMUNITY)
Admission: RE | Admit: 2014-06-22 | Discharge: 2014-06-22 | Disposition: A | Discharge status: Home / Self Care | Payer: Medicaid Other | Source: Ambulatory Visit | Attending: Family Medicine | Admitting: Family Medicine

## 2014-06-22 DIAGNOSIS — R0600 Dyspnea, unspecified: Secondary | ICD-10-CM

## 2014-06-22 DIAGNOSIS — R0602 Shortness of breath: Secondary | ICD-10-CM | POA: Insufficient documentation

## 2014-06-22 LAB — COMPREHENSIVE METABOLIC PANEL
ALT: 20 U/L (ref 0–35)
AST: 17 U/L (ref 0–37)
Albumin: 3.8 g/dL (ref 3.5–5.2)
Alkaline Phosphatase: 55 U/L (ref 39–117)
Anion gap: 2 — ABNORMAL LOW (ref 5–15)
BUN: 9 mg/dL (ref 6–23)
CO2: 25 mmol/L (ref 19–32)
Calcium: 8.7 mg/dL (ref 8.4–10.5)
Chloride: 108 mmol/L (ref 96–112)
Creatinine, Ser: 0.71 mg/dL (ref 0.50–1.10)
GFR calc Af Amer: >90 mL/min (ref >90)
GFR calc non Af Amer: >90 mL/min (ref >90)
Glucose, Bld: 115 mg/dL — ABNORMAL HIGH (ref 70–99)
Potassium: 3.4 mmol/L — ABNORMAL LOW (ref 3.5–5.1)
Sodium: 135 mmol/L (ref 135–145)
Total Bilirubin: 0.6 mg/dL (ref 0.3–1.2)
Total Protein: 7 g/dL (ref 6.0–8.3)

## 2014-06-22 LAB — CBC WITH DIFFERENTIAL/PLATELET
Basophils Absolute: 0 10*3/uL (ref 0.0–0.1)
Basophils Relative: 0 % (ref 0–1)
Eosinophils Absolute: 0.1 10*3/uL (ref 0.0–0.7)
Eosinophils Relative: 1 % (ref 0–5)
HCT: 33.7 % — ABNORMAL LOW (ref 36.0–46.0)
Hemoglobin: 11.2 g/dL — ABNORMAL LOW (ref 12.0–15.0)
Lymphocytes Relative: 27 % (ref 12–46)
Lymphs Abs: 2 10*3/uL (ref 0.7–4.0)
MCH: 26.5 pg (ref 26.0–34.0)
MCHC: 33.2 g/dL (ref 30.0–36.0)
MCV: 79.7 fL (ref 78.0–100.0)
Monocytes Absolute: 0.4 10*3/uL (ref 0.1–1.0)
Monocytes Relative: 5 % (ref 3–12)
Neutro Abs: 4.9 10*3/uL (ref 1.7–7.7)
Neutrophils Relative %: 67 % (ref 43–77)
Platelets: 199 10*3/uL (ref 150–400)
RBC: 4.23 MIL/uL (ref 3.87–5.11)
RDW: 13.6 % (ref 11.5–15.5)
WBC: 7.4 10*3/uL (ref 4.0–10.5)

## 2014-06-22 LAB — LIPID PANEL
Cholesterol: 253 mg/dL — ABNORMAL HIGH (ref 0–200)
HDL: 33 mg/dL — ABNORMAL LOW (ref >39)
LDL Cholesterol: UNDETERMINED mg/dL (ref 0–99)
Total CHOL/HDL Ratio: 7.7 RATIO
Triglycerides: 415 mg/dL — ABNORMAL HIGH (ref <150)
VLDL: UNDETERMINED mg/dL (ref 0–40)

## 2014-06-22 LAB — TSH: TSH: 1.094 u[IU]/mL (ref 0.350–4.500)

## 2014-06-22 LAB — D-DIMER, QUANTITATIVE: D-Dimer, Quant: 0.5 ug/mL-FEU — ABNORMAL HIGH (ref 0.00–0.48)

## 2014-06-25 ENCOUNTER — Telehealth: Payer: Self-pay | Admitting: Internal Medicine

## 2014-06-25 NOTE — Telephone Encounter (Signed)
[facility] (Dr [facility]) for appointment on 07/21/14 with Dr Rennis GoldenHilty.  [facility] Dr Blanchie DessertHilty's schedule on 07/21/14. lp

## 2014-07-10 ENCOUNTER — Ambulatory Visit (HOSPITAL_COMMUNITY)
Admission: RE | Admit: 2014-07-10 | Discharge: 2014-07-10 | Disposition: A | Discharge status: Home / Self Care | Payer: Medicaid Other | Source: Ambulatory Visit | Attending: Family Medicine | Admitting: Family Medicine

## 2014-07-10 ENCOUNTER — Other Ambulatory Visit (HOSPITAL_COMMUNITY): Payer: Self-pay | Admitting: Family Medicine

## 2014-07-10 DIAGNOSIS — M545 Low back pain: Secondary | ICD-10-CM | POA: Insufficient documentation

## 2014-07-21 ENCOUNTER — Encounter: Payer: Self-pay | Admitting: Internal Medicine

## 2014-07-21 ENCOUNTER — Ambulatory Visit: Payer: Medicaid Other | Admitting: Internal Medicine

## 2014-07-21 ENCOUNTER — Ambulatory Visit (INDEPENDENT_AMBULATORY_CARE_PROVIDER_SITE_OTHER): Payer: Medicaid Other | Admitting: Internal Medicine

## 2014-07-21 VITALS — BP 116/86 | HR 84 | Ht 69.0 in | Wt 156.9 lb

## 2014-07-21 DIAGNOSIS — R002 Palpitations: Secondary | ICD-10-CM | POA: Insufficient documentation

## 2014-07-21 DIAGNOSIS — R079 Chest pain, unspecified: Secondary | ICD-10-CM | POA: Insufficient documentation

## 2014-07-21 DIAGNOSIS — R0602 Shortness of breath: Secondary | ICD-10-CM

## 2014-07-21 DIAGNOSIS — R0600 Dyspnea, unspecified: Secondary | ICD-10-CM

## 2014-07-21 DIAGNOSIS — R0789 Other chest pain: Secondary | ICD-10-CM

## 2014-07-21 NOTE — Patient Instructions (Signed)
Dr Rennis GoldenHilty has ordered a cardiometabolic test - this is done @ [facility].  Please follow up after this test with Dr. Rennis GoldenHilty.  What is a Cardiopulmonary Exercise Test (CPET)?   The Cardiopulmonary Exercise Test is a highly sensitive, non-invasive stress test. It is considered a stress test because the exercise stresses your body's systems by making them work faster and harder. A disease or condition that affects the heart, lungs or muscles will limit how much faster and harder these systems can work. A CPET assesses how well the heart, lungs, and muscles are working individually, and how these systems are working in unison. Your heart and lungs work together to deliver oxygen to your muscles, where it is used to make energy, and to remove carbon dioxide from your body.  The full cardiopulmonary system is assessed during a CPET by measuring the amount of oxygen your body is using, the amount of carbon dioxide it is producing, your breathing pattern, and electrocardiogram (EKG) while you are riding a stationary bicycle.  The traditional treadmill stress test only relies on the EKG, which only partially assesses the heart and nothing else. Besides detecting problems in multiple body systems, the CPET is also used to monitor changes in your disease condition, the effect of certain medications on your body, and if medical therapy is improving your condition.  What conditions can be detected/monitored by the Cardiopulmonary ExerciseTest?  Heart, lung, and metabolic conditions may cause shortness of breath, exercise intolerance or discomfort and pain in the chest. The CPET is the only test that can simultaneously determine which of these systems is causing the problem

## 2014-07-21 NOTE — Addendum Note (Signed)
Addended by: Lindell SparELKINS, Dewon Mendizabal M on: 07/21/2014 02:49 PM   Modules accepted: Orders

## 2014-07-21 NOTE — Progress Notes (Signed)
OFFICE NOTE  Chief Complaint:  Chest pain, palpitations, dyspnea on exertion  Primary Care Physician: Milana ObeyKNOWLTON,STEPHEN D, MD  HPI:  Raven Smith is a pleasant 44 year old female with few previous medical problems. She has a history of low back pain and radicular symptoms down the right leg. She's been prescribed and taking Neurontin and meloxicam for this as well as occasional Flexeril. She also has low vitamin D and some reflux history on medications. Over the past 6 months she's had some significant stress, her father had terminal cancer and was on hospice and died at home. Around that time she noted she was becoming short of breath doing certain activities. In fact minimal activity such as cleaning house or washing dishes was causing her to become short of breath. She did have one episode of chest discomfort which was significant and seem to last for weeks. She does not have any chest pain associated with exertion or relieved by rest. She does get short of breath just walking up stairs. She has no history of hypertension, diabetes, smoking or alcohol or drug use. Family history significant for heart disease in her grandmother but no significant first-degree relative with heart disease  PMHx:  Past Medical History  Diagnosis Date  . Chronic back pain   . Arthritis   . Irregular menstrual bleeding 04/23/2013  . 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 Cholecystectomy    . Tubal ligation      FAMHx:  Family History  Problem Relation Age of Onset  . Fibroids Mother   . Diabetes Mother   . Diabetes Father   . Diabetes Maternal Grandmother   . Heart failure Maternal Grandmother     SOCHx:   reports that she has never smoked. She has never used smokeless tobacco. She reports that she drinks alcohol. She reports that she does not use illicit drugs.  ALLERGIES:  No Known Allergies  ROS: A comprehensive review of systems  was negative except for: Constitutional: positive for fatigue Respiratory: positive for dyspnea on exertion Cardiovascular: positive for palpitations  HOME MEDS: Current Outpatient Prescriptions  Medication Sig Dispense Refill  . acetaminophen (TYLENOL) 325 MG tablet Take 650 mg by mouth every 6 (six) hours as needed for pain.    . cyclobenzaprine (FLEXERIL) 10 MG tablet Take 1 tablet (10 mg total) by mouth 2 (two) times daily as needed for muscle spasms. 20 tablet 0  . gabapentin (NEURONTIN) 300 MG capsule Take 1 capsule by mouth every morning, 1 capsule @@2pm , 2 capsules at bedtime    . meloxicam (MOBIC) 7.5 MG tablet Take 7.5 mg by mouth daily.    Marland Kitchen. omeprazole (PRILOSEC) 20 MG capsule Take 1 capsule by mouth daily.  1  . Vitamin D, Ergocalciferol, (DRISDOL) 50000 UNITS CAPS capsule Take 1 capsule by mouth once a week.  1   No current facility-administered medications for this visit.    LABS/IMAGING: No results found for this or any previous visit (from the past 48 hour(s)). No results found.  VITALS: BP 116/86 mmHg  Pulse 84  Ht 5\' 9"  (1.753 m)  Wt 156 lb 14.4 oz (71.169 kg)  BMI 23.16 kg/m2  LMP 07/07/2014  EXAM: General appearance: alert and no distress Neck: no carotid bruit and no JVD Lungs: clear to auscultation bilaterally Heart: regular rate and rhythm, S1, S2 normal, no murmur, click, rub or gallop Abdomen: soft, non-tender; bowel sounds normal; no masses,  no  organomegaly Extremities: extremities normal, atraumatic, no cyanosis or edema Pulses: 2+ and symmetric Skin: Skin color, texture, turgor normal. No rashes or lesions Neurologic: Grossly normal Psych: Pleasant, does not appear anxious  EKG: Normal sinus rhythm at 84  ASSESSMENT: 1. Subacute onset shortness of breath with exertion 2. Atypical chest pain 3. Occasional palpitations  PLAN: 1.   Mrs. Brocker is having a new constellation of symptoms which come on over the past several months. They do not  sound typical for coronary artery disease and she has few if any risk factors. She also does not describe symptoms concerning for cardiomyopathy in her physical exam does not support any congestive heart failure, however certainly an acquired cardiomyopathy could cause her symptoms. It sounds like she gets short of breath doing certain activities but not during others. She occasionally gets palpitations as well. She cannot answer whether she feels like her heart races or not, but that may be playing a role in things. I think would be helpful to look at both cardiac and pulmonary function as possible causes of her shortness of breath. I think she is a good candidate for cardio metabolic stress testing, and I will refer her for that testing at [facility]. Plan to see her back to discuss the results of the studies in a few weeks.  Thank you as always for the kind referral.  Raven Nose, MD, [facility] Attending Cardiologist [facility]  Raven Smith C 07/21/2014, 1:00 PM

## 2014-07-21 NOTE — Addendum Note (Signed)
Addended by: Lindell SparELKINS, JENNA M on: 07/21/2014 02:08 PM   Modules accepted: Orders

## 2014-07-22 ENCOUNTER — Encounter: Payer: Self-pay | Admitting: Internal Medicine

## 2014-07-24 ENCOUNTER — Ambulatory Visit (HOSPITAL_COMMUNITY)
Admission: RE | Admit: 2014-07-24 | Discharge: 2014-07-24 | Disposition: A | Discharge status: Home / Self Care | Payer: Medicaid Other | Source: Ambulatory Visit | Attending: Family Medicine | Admitting: Family Medicine

## 2014-07-24 DIAGNOSIS — R0600 Dyspnea, unspecified: Secondary | ICD-10-CM | POA: Insufficient documentation

## 2014-07-24 LAB — BLOOD GAS, ARTERIAL
Acid-Base Excess: 0.1 mmol/L (ref 0.0–2.0)
Bicarbonate: 24.2 mEq/L — ABNORMAL HIGH (ref 20.0–24.0)
FIO2: 0.21 %
O2 Saturation: 96.6 %
Patient temperature: 37
TCO2: 22 mmol/L (ref 0–100)
pCO2 arterial: 39.1 mmHg (ref 35.0–45.0)
pH, Arterial: 7.408 (ref 7.350–7.450)
pO2, Arterial: 87.5 mmHg (ref 80.0–100.0)

## 2014-08-13 ENCOUNTER — Telehealth (HOSPITAL_COMMUNITY): Payer: Self-pay | Admitting: Unknown Physician Specialty

## 2014-08-13 ENCOUNTER — Encounter (HOSPITAL_COMMUNITY): Payer: Medicaid Other

## 2014-08-28 ENCOUNTER — Ambulatory Visit (HOSPITAL_COMMUNITY): Payer: Medicaid Other | Attending: Internal Medicine

## 2014-08-28 DIAGNOSIS — R079 Chest pain, unspecified: Secondary | ICD-10-CM | POA: Insufficient documentation

## 2014-08-28 DIAGNOSIS — R0602 Shortness of breath: Secondary | ICD-10-CM

## 2014-08-28 DIAGNOSIS — R0600 Dyspnea, unspecified: Secondary | ICD-10-CM | POA: Diagnosis present

## 2014-08-28 DIAGNOSIS — R0789 Other chest pain: Secondary | ICD-10-CM

## 2014-08-28 DIAGNOSIS — R002 Palpitations: Secondary | ICD-10-CM | POA: Diagnosis not present

## 2014-10-06 ENCOUNTER — Ambulatory Visit: Payer: Medicaid Other | Admitting: Internal Medicine

## 2014-11-12 ENCOUNTER — Ambulatory Visit: Payer: Medicaid Other | Admitting: Obstetrics & Gynecology

## 2014-11-20 ENCOUNTER — Other Ambulatory Visit: Payer: Medicaid Other | Admitting: Adult Health

## 2014-12-07 ENCOUNTER — Ambulatory Visit: Payer: Medicaid Other | Admitting: Internal Medicine

## 2016-03-08 ENCOUNTER — Other Ambulatory Visit (HOSPITAL_COMMUNITY): Payer: Self-pay | Admitting: Family

## 2016-03-08 DIAGNOSIS — Z1231 Encounter for screening mammogram for malignant neoplasm of breast: Secondary | ICD-10-CM

## 2016-03-20 ENCOUNTER — Ambulatory Visit (HOSPITAL_COMMUNITY)
Admission: RE | Admit: 2016-03-20 | Discharge: 2016-03-20 | Disposition: A | Discharge status: Home / Self Care | Payer: Self-pay | Source: Ambulatory Visit | Attending: Family | Admitting: Family

## 2016-03-20 DIAGNOSIS — Z1231 Encounter for screening mammogram for malignant neoplasm of breast: Secondary | ICD-10-CM

## 2018-12-02 ENCOUNTER — Emergency Department (HOSPITAL_COMMUNITY): Payer: BC Managed Care – PPO

## 2018-12-02 ENCOUNTER — Emergency Department (HOSPITAL_COMMUNITY)
Admission: EM | Admit: 2018-12-02 | Discharge: 2018-12-02 | Disposition: A | Discharge status: Home / Self Care | Payer: BC Managed Care – PPO | Attending: Emergency Medicine | Admitting: Emergency Medicine

## 2018-12-02 ENCOUNTER — Other Ambulatory Visit: Payer: Self-pay

## 2018-12-02 ENCOUNTER — Encounter (HOSPITAL_COMMUNITY): Payer: Self-pay

## 2018-12-02 DIAGNOSIS — Z7982 Long term (current) use of aspirin: Secondary | ICD-10-CM | POA: Diagnosis not present

## 2018-12-02 DIAGNOSIS — Z79899 Other long term (current) drug therapy: Secondary | ICD-10-CM | POA: Diagnosis not present

## 2018-12-02 DIAGNOSIS — R531 Weakness: Secondary | ICD-10-CM | POA: Insufficient documentation

## 2018-12-02 DIAGNOSIS — D649 Anemia, unspecified: Secondary | ICD-10-CM | POA: Diagnosis not present

## 2018-12-02 DIAGNOSIS — R079 Chest pain, unspecified: Secondary | ICD-10-CM | POA: Diagnosis present

## 2018-12-02 LAB — BASIC METABOLIC PANEL
Anion gap: 7 (ref 5–15)
BUN: 7 mg/dL (ref 6–20)
CO2: 23 mmol/L (ref 22–32)
Calcium: 9.2 mg/dL (ref 8.9–10.3)
Chloride: 106 mmol/L (ref 98–111)
Creatinine, Ser: 0.62 mg/dL (ref 0.44–1.00)
GFR calc Af Amer: >60 mL/min (ref >60)
GFR calc non Af Amer: >60 mL/min (ref >60)
Glucose, Bld: 129 mg/dL — ABNORMAL HIGH (ref 70–99)
Potassium: 3.2 mmol/L — ABNORMAL LOW (ref 3.5–5.1)
Sodium: 136 mmol/L (ref 135–145)

## 2018-12-02 LAB — TROPONIN I (HIGH SENSITIVITY)
Troponin I (High Sensitivity): <2 ng/L (ref <18)
Troponin I (High Sensitivity): <2 ng/L (ref <18)

## 2018-12-02 LAB — CBC
HCT: 30.6 % — ABNORMAL LOW (ref 36.0–46.0)
Hemoglobin: 8.9 g/dL — ABNORMAL LOW (ref 12.0–15.0)
MCH: 20.1 pg — ABNORMAL LOW (ref 26.0–34.0)
MCHC: 29.1 g/dL — ABNORMAL LOW (ref 30.0–36.0)
MCV: 69.1 fL — ABNORMAL LOW (ref 80.0–100.0)
Platelets: 284 10*3/uL (ref 150–400)
RBC: 4.43 MIL/uL (ref 3.87–5.11)
RDW: 18.8 % — ABNORMAL HIGH (ref 11.5–15.5)
WBC: 8.6 10*3/uL (ref 4.0–10.5)
nRBC: 0 % (ref 0.0–0.2)

## 2018-12-02 MED ORDER — SODIUM CHLORIDE 0.9% FLUSH: 3.0000 mL | Freq: Once | INTRAVENOUS | Status: DC

## 2018-12-02 MED ORDER — MULTI-VITAMIN/MINERALS PO TABS: 1.0000 | ORAL_TABLET | Freq: Every day | ORAL | 1 refills | Status: AC

## 2018-12-02 MED ORDER — FERROUS SULFATE 325 (65 FE) MG PO TABS: 325.0000 mg | ORAL_TABLET | Freq: Every day | ORAL | 2 refills | Status: DC

## 2018-12-02 NOTE — ED Triage Notes (Signed)
Pt presents to ED with complaints of two episodes of mid chest pain over the weekend. Pt states she also had weakness, SOB. Pt states the episodes lasted approx a couple min each. Pt states she hasn't had any episodes since Saturday.

## 2018-12-02 NOTE — Discharge Instructions (Signed)
Return if any problems.  See Dr. Glo Herring for evaluation

## 2018-12-02 NOTE — ED Provider Notes (Signed)
[facility] EMERGENCY DEPARTMENT Provider Note   CSN: [id number] Arrival date & time: 12/02/18  1312     History   Chief Complaint Chief Complaint  Patient presents with  . Chest Pain    HPI Kamsiyochukwu A Lady GaryCannon is a 48 y.o. female.     Patient reports she had 2 episodes of mid chest pain over the weekend she reports episodes lasted less than 2 minutes.  She reports she has been feeling weak and fatigued recently.  Patient does have a history of anemia she does report heavy periods.  The history is provided by the patient. No language interpreter was used.  Chest Pain Pain location:  L chest Pain quality: aching   Pain radiates to:  Does not radiate Pain severity:  Mild Onset quality:  Gradual Duration:  3 days Timing:  Intermittent Progression:  Resolved Chronicity:  New Relieved by:  Nothing Worsened by:  Nothing Ineffective treatments:  None tried Associated symptoms: no abdominal pain     Past Medical History:  Diagnosis Date  . Arthritis   . Chronic back pain   . 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 Irregular menstrual bleeding 04/23/2013    Patient Active Problem List   Diagnosis Date Noted  . Chest pain 07/21/2014  . Dyspnea 07/21/2014  . Palpitations 07/21/2014  . Irregular menstrual bleeding 04/23/2013  . 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 CHOLECYSTECTOMY    . TUBAL LIGATION       OB History    Gravida  3   Para  2   Term      Preterm  2   AB  1   Living  2     SAB  1   TAB      Ectopic      Multiple      Live Births               Home Medications    Prior to Admission medications   Medication Sig Start Date End Date Taking? Authorizing Provider  aspirin 81 MG EC tablet Take 162 mg by mouth once as needed for mild pain or moderate pain.   Yes [provider]  ferrous sulfate 325 (65 FE) MG tablet Take 1 tablet (325 mg total) by mouth daily. 12/02/18  12/02/19  Elson AreasSofia, Lin Hackmann K, PA-C  Multiple Vitamins-Minerals (MULTIVITAMIN WITH MINERALS) tablet Take 1 tablet by mouth daily. 12/02/18 04/01/19  Elson AreasSofia, Lacrecia Delval K, PA-C    Family History Family History  Problem Relation Age of Onset  . Fibroids Mother   . Diabetes Mother   . Diabetes Father   . Diabetes Maternal Grandmother   . Heart failure Maternal Grandmother     Social History Social History   Tobacco Use  . Smoking status: Never Smoker  . Smokeless tobacco: Never Used  Substance Use Topics  . Alcohol use: Yes    Comment: occasional  . Drug use: No     Allergies   Patient has no known allergies.   Review of Systems Review of Systems  Cardiovascular: Positive for chest pain.  Gastrointestinal: Negative for abdominal pain.  All other systems reviewed and are negative.    Physical Exam Updated Vital Signs BP 110/77 (BP Location: Right Arm)   Pulse 88   Temp 98.6 F (37 C) (Oral)   Resp 13   Ht 5\' 9"  (1.753 m)   Wt 68 kg   LMP 11/15/2018  SpO2 100%   BMI 22.15 kg/m   Physical Exam Vitals signs and nursing note reviewed.  Constitutional:      Appearance: She is well-developed.  HENT:     Head: Normocephalic.  Neck:     Musculoskeletal: Normal range of motion.  Cardiovascular:     Rate and Rhythm: Normal rate and regular rhythm.  Pulmonary:     Effort: Pulmonary effort is normal.     Breath sounds: Normal breath sounds.  Chest:     Chest wall: No mass or tenderness.  Abdominal:     General: Bowel sounds are normal. There is no distension.     Palpations: Abdomen is soft.  Musculoskeletal: Normal range of motion.  Skin:    General: Skin is warm.  Neurological:     General: No focal deficit present.     Mental Status: She is alert and oriented to person, place, and time.      ED Treatments / Results  Labs (all labs ordered are listed, but only abnormal results are displayed) Labs Reviewed  BASIC METABOLIC PANEL - Abnormal; Notable for the  following components:      Result Value   Potassium 3.2 (*)    Glucose, Bld 129 (*)    All other components within normal limits  CBC - Abnormal; Notable for the following components:   Hemoglobin 8.9 (*)    HCT 30.6 (*)    MCV 69.1 (*)    MCH 20.1 (*)    MCHC 29.1 (*)    RDW 18.8 (*)    All other components within normal limits  TROPONIN I (HIGH SENSITIVITY)  TROPONIN I (HIGH SENSITIVITY)    EKG EKG Interpretation  Date/Time:  Monday December 02 2018 13:29:22 EDT Ventricular Rate:  106 PR Interval:  126 QRS Duration: 76 QT Interval:  312 QTC Calculation: 414 R Axis:   57 Text Interpretation:  Sinus tachycardia Septal infarct , age undetermined Abnormal ECG Confirmed by Nat Christen [phone]) on 12/02/2018 4:33:47 PM   Radiology Dg Chest 2 View  Result Date: 12/02/2018 CLINICAL DATA:  Two episodes of chest pain over the weekend. EXAM: CHEST - 2 VIEW COMPARISON:  Chest x-ray dated June 22, 2014. FINDINGS: The heart size and mediastinal contours are within normal limits. Both lungs are clear. The visualized skeletal structures are unremarkable. IMPRESSION: No active cardiopulmonary disease. Electronically Signed   By: Titus Dubin M.D.   On: 12/02/2018 14:03    Procedures Procedures (including critical care time)  Medications Ordered in ED Medications - No data to display   Initial Impression / Assessment and Plan / ED Course  I have reviewed the triage vital signs and the nursing notes.  Pertinent labs & imaging results that were available during my care of the patient were reviewed by me and considered in my medical decision making (see chart for details).        MDM: Patient's chest x-ray is normal EKG shows nonspecific changes.  Patient had 2- Negative high sensitive troponins.  Patient's hemoglobin is 8.9 potassium is 3.2.  I discussed patient's symptoms with her I feel like her symptoms are probably secondary to anemia I will start her on a multivitamin as well  as an iron supplement she is advised to make a appointment to see her gynecologist Dr. Glo Herring for further evaluation she is advised to return to the emergency department if further chest pain or concerning symptoms. Negative   Final diagnoses:  Weakness  Anemia, unspecified type  ED Discharge Orders         Ordered    ferrous sulfate 325 (65 FE) MG tablet  Daily     12/02/18 1731    Multiple Vitamins-Minerals (MULTIVITAMIN WITH MINERALS) tablet  Daily     12/02/18 1731        An After Visit Summary was printed and given to the patient.    Osie CheeksSofia, Oluwasemilore Bahl K, PA-C 12/02/18 2332    Donnetta Hutchingook, Brian, MD 12/03/18 [phone]

## 2018-12-04 ENCOUNTER — Other Ambulatory Visit: Payer: BC Managed Care – PPO

## 2018-12-04 ENCOUNTER — Other Ambulatory Visit: Payer: Self-pay

## 2018-12-04 DIAGNOSIS — Z20822 Contact with and (suspected) exposure to covid-19: Secondary | ICD-10-CM

## 2018-12-08 LAB — NOVEL CORONAVIRUS, NAA: SARS-CoV-2, NAA: NOT DETECTED

## 2018-12-24 ENCOUNTER — Telehealth: Payer: Self-pay | Admitting: Advanced Practice Midwife

## 2018-12-24 NOTE — Telephone Encounter (Signed)
Unable to reach pt with restrictions.  

## 2018-12-25 ENCOUNTER — Ambulatory Visit (INDEPENDENT_AMBULATORY_CARE_PROVIDER_SITE_OTHER): Payer: BC Managed Care – PPO | Admitting: Advanced Practice Midwife

## 2018-12-25 ENCOUNTER — Other Ambulatory Visit (HOSPITAL_COMMUNITY)
Admission: RE | Admit: 2018-12-25 | Discharge: 2018-12-25 | Disposition: A | Discharge status: Home / Self Care | Payer: BC Managed Care – PPO | Source: Ambulatory Visit | Attending: Advanced Practice Midwife | Admitting: Advanced Practice Midwife

## 2018-12-25 ENCOUNTER — Encounter: Payer: Self-pay | Admitting: Advanced Practice Midwife

## 2018-12-25 ENCOUNTER — Other Ambulatory Visit: Payer: Self-pay

## 2018-12-25 VITALS — BP 110/76 | HR 99 | Ht 69.0 in | Wt 150.0 lb

## 2018-12-25 DIAGNOSIS — Z01419 Encounter for gynecological examination (general) (routine) without abnormal findings: Secondary | ICD-10-CM

## 2018-12-25 DIAGNOSIS — N946 Dysmenorrhea, unspecified: Secondary | ICD-10-CM | POA: Insufficient documentation

## 2018-12-25 DIAGNOSIS — N941 Unspecified dyspareunia: Secondary | ICD-10-CM | POA: Insufficient documentation

## 2018-12-25 DIAGNOSIS — E782 Mixed hyperlipidemia: Secondary | ICD-10-CM

## 2018-12-25 MED ORDER — ORILISSA 150 MG PO TABS: 150.0000 mg | ORAL_TABLET | Freq: Every day | ORAL | 6 refills | Status: DC

## 2018-12-25 NOTE — Patient Instructions (Signed)
Endometriosis  Endometriosis is a condition in which the tissue that lines the uterus (endometrium) grows outside of its normal location. The tissue may grow in many locations close to the uterus, but it commonly grows on the ovaries, fallopian tubes, vagina, or bowel. When the uterus sheds the endometrium every menstrual cycle, there is bleeding wherever the endometrial tissue is located. This can cause pain because blood is irritating to tissues that are not normally exposed to it. What are the causes? The cause of endometriosis is not known. What increases the risk? You may be more likely to develop endometriosis if you:  Have a family history of endometriosis.  Have never given birth.  Started your period at age 10 or younger.  Have high levels of estrogen in your body.  Were exposed to a certain medicine (diethylstilbestrol) before you were born (in utero).  Had low birth weight.  Were born as a twin, triplet, or other multiple.  Have a BMI of less than 25. BMI is an estimate of body fat and is calculated from height and weight. What are the signs or symptoms? Often, there are no symptoms of this condition. If you do have symptoms, they may:  Vary depending on where your endometrial tissue is growing.  Occur during your menstrual period (most common) or midcycle.  Come and go, or you may go months with no symptoms at all.  Stop with menopause. Symptoms may include:  Pain in the back or abdomen.  Heavier bleeding during periods.  Pain during sex.  Painful bowel movements.  Infertility.  Pelvic pain.  Bleeding more than once a month. How is this diagnosed? This condition is diagnosed based on your symptoms and a physical exam. You may have tests, such as:  Blood tests and urine tests. These may be done to help rule out other possible causes of your symptoms.  Ultrasound, to look for abnormal tissues.  An X-ray of the lower bowel (barium enema).  An  ultrasound that is done through the vagina (transvaginally).  CT scan.  MRI.  Laparoscopy. In this procedure, a lighted, pencil-sized instrument called a laparoscope is inserted into your abdomen through an incision. The laparoscope allows your health care provider to look at the organs inside your body and check for abnormal tissue to confirm the diagnosis. If abnormal tissue is found, your health care provider may remove a small piece of tissue (biopsy) to be examined under a microscope. How is this treated? Treatment for this condition may include:  Medicines to relieve pain, such as NSAIDs.  Hormone therapy. This involves using artificial (synthetic) hormones to reduce endometrial tissue growth. Your health care provider may recommend using a hormonal form of birth control, or other medicines.  Surgery. This may be done to remove abnormal endometrial tissue. ? In some cases, tissue may be removed using a laparoscope and a laser (laparoscopic laser treatment). ? In severe cases, surgery may be done to remove the fallopian tubes, uterus, and ovaries (hysterectomy). Follow these instructions at home:  Take over-the-counter and prescription medicines only as told by your health care provider.  Do not drive or use heavy machinery while taking prescription pain medicine.  Try to avoid activities that cause pain, including sexual activity.  Keep all follow-up visits as told by your health care provider. This is important. Contact a health care provider if:  You have pain in the area between your hip bones (pelvic area) that occurs: ? Before, during, or after your period. ?   In between your period and gets worse during your period. ? During or after sex. ? With bowel movements or urination, especially during your period.  You have problems getting pregnant.  You have a fever. Get help right away if:  You have severe pain that does not get better with medicine.  You have severe  nausea and vomiting, or you cannot eat without vomiting.  You have pain that affects only the lower, right side of your abdomen.  You have abdominal pain that gets worse.  You have abdominal swelling.  You have blood in your stool. This information is not intended to replace advice given to you by your health care provider. Make sure you discuss any questions you have with your health care provider. Document Released: 04/28/2000 Document Revised: 04/13/2017 Document Reviewed: 10/02/2015 Elsevier Patient Education  2020 Elsevier Inc.  

## 2018-12-25 NOTE — Progress Notes (Addendum)
Santasia A Smith 48 y.o.  Vitals:   12/25/18 1020  BP: 110/76  Pulse: 99     Filed Weights   12/25/18 1020  Weight: 150 lb (68 kg)    Past Medical History: Past Medical History:  Diagnosis Date  . Arthritis   . Chronic back pain   . 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 Hyperlipidemia 2014   rx'd zocor; didn't do  . Irregular menstrual bleeding 04/23/2013    Past Surgical History: Past Surgical History:  Procedure Laterality Date  . CHOLECYSTECTOMY    . TUBAL LIGATION      Family History: Family History  Problem Relation Age of Onset  . Fibroids Mother   . Diabetes Mother   . Diabetes Father   . Diabetes Maternal Grandmother   . Heart failure Maternal Grandmother   . Other Son        MVA    Social History: Social History   Tobacco Use  . Smoking status: Never Smoker  . Smokeless tobacco: Never Used  Substance Use Topics  . Alcohol use: Yes    Comment: occasional  . Drug use: No    Allergies: No Known Allergies    Current Outpatient Medications:  Marland Kitchen  Multiple Vitamins-Minerals (MULTIVITAMIN WITH MINERALS) tablet, Take 1 tablet by mouth daily., Disp: 60 tablet, Rfl: 1 .  Elagolix Sodium (ORILISSA) 150 MG TABS, Take 150 mg by mouth daily., Disp: 30 tablet, Rfl: 6  History of Present Illness: here for pap and physical. Last pap 2014.  Seen in ED several weeks ago for CP, felt to be 2/2 anemia.  Hgb 8.9 (hx of anemia w/heavy periods)> has been evaluated in the past, Raven Smith had discussed Ablation vs COCs. Was rx'd FeSO4 but never took.  (her 29 yo son died in a car crash 5 months ago, having a hard time, but determined to do better w/her health now).   Heavy bleeding and lots of pain w/periods.  Also has pain several days after intercourse. Feels sore. She was inactive for 1.5 years while her husband went through chemo, but has had sex every month or so for a few months.  Has had "severe" dysmenorrhea for years. Vagina is dryer than usual, feels like it's  "open" more.  Had mammogram a few years ago.  Dx w/high cholesterol years ago, didn't follow up. Raven Smith is her PCP     Review of Systems   Patient denies any headaches, blurred vision, shortness of breath, chest pain, problems with bowel movements, urination   Physical Exam: General:  Well developed, well nourished, no acute distress Skin:  Warm and dry Neck:  Midline trachea, normal thyroid Lungs; Clear to auscultation bilaterally Breast:  No dominant palpable mass, retraction, or nipple discharge Cardiovascular: Regular rate and rhythm Abdomen:  Soft, non tender, no hepatosplenomegaly Pelvic:  External genitalia is normal in appearance.  The vagina is normal in appearance. Tone is good. The cervix is bulbous.  Uterus is felt to be normal size, shape, and contour.  No adnexal masses or tenderness noted.  Extremities:  No swelling or varicosities noted Psych:  No mood changes.     Impression: Normal GYN exam  Dysmenorrhea w/dyspareunia for several days.        Plan: Discussed options:  Try Raven Smith for a few months. Pelvic US to r/o structural source of pain  Reluctant for COCs until cholesterol is under control--to see PCP and get full bloodwork/management  Discussed ablation, may consider if not responsive to Raven Smith.

## 2018-12-26 ENCOUNTER — Telehealth: Payer: Self-pay | Admitting: Advanced Practice Midwife

## 2018-12-26 LAB — CYTOLOGY - PAP
Adequacy: ABSENT
Chlamydia: NEGATIVE
Diagnosis: NEGATIVE
HPV: NOT DETECTED
Neisseria Gonorrhea: NEGATIVE

## 2018-12-26 NOTE — Telephone Encounter (Signed)
Patient called and stated she was seen yesterday and she has thought of another question for [name].  She wants to see if there's anyway if she can have an ultrasound to make sure she does not have a cyst or tumor.  [phone]

## 2018-12-26 NOTE — Addendum Note (Signed)
Addended by: Christin Fudge on: 12/26/2018 04:29 PM   Modules accepted: Orders

## 2018-12-26 NOTE — Telephone Encounter (Signed)
Ordered, Daisy to call and schedule

## 2019-01-01 ENCOUNTER — Telehealth: Payer: Self-pay | Admitting: *Deleted

## 2019-01-01 NOTE — Telephone Encounter (Signed)
Pharmacy made aware PA for [name] was approved.

## 2019-01-07 ENCOUNTER — Other Ambulatory Visit: Payer: Self-pay | Admitting: Advanced Practice Midwife

## 2019-01-07 ENCOUNTER — Telehealth: Payer: Self-pay | Admitting: Obstetrics & Gynecology

## 2019-01-07 DIAGNOSIS — N946 Dysmenorrhea, unspecified: Secondary | ICD-10-CM

## 2019-01-07 DIAGNOSIS — N941 Unspecified dyspareunia: Secondary | ICD-10-CM

## 2019-01-07 NOTE — Telephone Encounter (Signed)

## 2019-01-08 ENCOUNTER — Other Ambulatory Visit: Payer: Self-pay

## 2019-01-08 ENCOUNTER — Other Ambulatory Visit: Payer: Self-pay | Admitting: Advanced Practice Midwife

## 2019-01-08 ENCOUNTER — Ambulatory Visit (INDEPENDENT_AMBULATORY_CARE_PROVIDER_SITE_OTHER): Payer: BC Managed Care – PPO

## 2019-01-08 DIAGNOSIS — N941 Unspecified dyspareunia: Secondary | ICD-10-CM

## 2019-01-08 DIAGNOSIS — N946 Dysmenorrhea, unspecified: Secondary | ICD-10-CM

## 2019-01-08 NOTE — Progress Notes (Signed)
PELVIC US TA/TV: homogeneous anteverted uterus,wnl,EEC 10.2 mm,echogenic endometrial mass .8 x .5 x .6 mm,? endometrial polyp,no color flow visualized,normal ovaries bilat,ovaries appear mobile,no free fluid,no pain during ultrasound  Chaperone: [employer] 01/08/2019, 1:13 PM x

## 2019-04-04 ENCOUNTER — Other Ambulatory Visit: Payer: Self-pay

## 2019-04-04 DIAGNOSIS — Z20822 Contact with and (suspected) exposure to covid-19: Secondary | ICD-10-CM

## 2019-04-07 LAB — NOVEL CORONAVIRUS, NAA: SARS-CoV-2, NAA: NOT DETECTED

## 2019-08-12 ENCOUNTER — Other Ambulatory Visit: Payer: Self-pay

## 2019-08-12 ENCOUNTER — Ambulatory Visit
Admission: EM | Admit: 2019-08-12 | Discharge: 2019-08-12 | Disposition: A | Discharge status: Home / Self Care | Payer: 59 | Attending: Emergency Medicine | Admitting: Emergency Medicine

## 2019-08-12 DIAGNOSIS — J0100 Acute maxillary sinusitis, unspecified: Secondary | ICD-10-CM

## 2019-08-12 DIAGNOSIS — R42 Dizziness and giddiness: Secondary | ICD-10-CM

## 2019-08-12 MED ORDER — AMOXICILLIN-POT CLAVULANATE 875-125 MG PO TABS: 1.0000 | ORAL_TABLET | Freq: Two times a day (BID) | ORAL | 0 refills | Status: AC

## 2019-08-12 MED ORDER — PREDNISONE 20 MG PO TABS: 20.0000 mg | ORAL_TABLET | Freq: Two times a day (BID) | ORAL | 0 refills | Status: AC

## 2019-08-12 MED ORDER — DEXAMETHASONE SODIUM PHOSPHATE 10 MG/ML IJ SOLN
10.0000 mg | Freq: Once | INTRAMUSCULAR | Status: AC
  Administered 2019-08-12: 10 mg via INTRAMUSCULAR

## 2019-08-12 NOTE — Discharge Instructions (Signed)
Steroid shot given in office Rest and push fluids Augmentin prescribed.  Take as directed and to completion Prednisone prescribed.  Take as directed and to completion Use OTC ibuprofen/tylenol as needed for pain Follow up with PCP for recheck Return or go to the ED if you have any new or worsening symptoms such as fever, chills, worsening sinus pain/pressure, cough, sore throat, chest pain, shortness of breath, abdominal pain, changes in bowel or bladder habits, etc..Marland Kitchen

## 2019-08-12 NOTE — ED Triage Notes (Signed)
Pt presents with congestion that has been worsening for 2 weeks. Denies relief with otc medications. Concerned for sinus infection.

## 2019-08-12 NOTE — ED Provider Notes (Signed)
[place]   [id number] 08/12/19 Arrival Time: 1504  Cc: Sinus pain  SUBJECTIVE:  Raven Smith is a 49 y.o. female who presents with sinus pain/ pressure, nasal congestion, ear fullness, and feeling dizzy x 2 weeks.  Denies sick exposure to COVID, flu or strep.  Denies recent travel.  Has tried OTC medication without relief.  Symptoms are made worse with position changes.  Reports previous symptoms in the past with sinus infection with associated dizziness.   Denies fever, chills, sore throat, cough, SOB, wheezing, chest pain, nausea, vomiting, changes in bowel or bladder habits.    ROS: As per HPI.  All other pertinent ROS negative.     Past Medical History:  Diagnosis Date  . Arthritis   . Chronic back pain   . 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 Hyperlipidemia 2014   rx'd zocor; didn't do  . Irregular menstrual bleeding 04/23/2013   Past Surgical History:  Procedure Laterality Date  . CHOLECYSTECTOMY    . TUBAL LIGATION     No Known Allergies No current facility-administered medications on file prior to encounter.   Current Outpatient Medications on File Prior to Encounter  Medication Sig Dispense Refill  . Elagolix Sodium (ORILISSA) 150 MG TABS Take 150 mg by mouth daily. 30 tablet 6    Social History   Socioeconomic History  . Marital status: Single    Spouse name: Not on file  . Number of children: Not on file  . Years of education: Not on file  . Highest education level: Not on file  Occupational History  . Not on file  Tobacco Use  . Smoking status: Never Smoker  . Smokeless tobacco: Never Used  Substance and Sexual Activity  . Alcohol use: Yes    Comment: occasional  . Drug use: No  . Sexual activity: Yes    Birth control/protection: Surgical    Comment: tubal  Other Topics Concern  . Not on file  Social History Narrative  . Not on file   Social Determinants of Health   Financial Resource Strain:   . Difficulty of Paying Living  Expenses:   Food Insecurity:   . Worried About Charity fundraiser in the Last Year:   . Arboriculturist in the Last Year:   Transportation Needs:   . Film/video editor (Medical):   Marland Kitchen Lack of Transportation (Non-Medical):   Physical Activity:   . Days of Exercise per Week:   . Minutes of Exercise per Session:   Stress:   . Feeling of Stress :   Social Connections:   . Frequency of Communication with Friends and Family:   . Frequency of Social Gatherings with Friends and Family:   . Attends Religious Services:   . Active Member of Clubs or Organizations:   . Attends Archivist Meetings:   Marland Kitchen Marital Status:   Intimate Partner Violence:   . Fear of Current or Ex-Partner:   . Emotionally Abused:   Marland Kitchen Physically Abused:   . Sexually Abused:    Family History  Problem Relation Age of Onset  . Fibroids Mother   . Diabetes Mother   . Diabetes Father   . Diabetes Maternal Grandmother   . Heart failure Maternal Grandmother   . Other Son        MVA     OBJECTIVE:  Vitals:   08/12/19 1511  BP: 119/67  Pulse: 92  Resp: 17  Temp: 97.7 F (36.5 C)  TempSrc: Oral  SpO2: 99%     General appearance: Alert, appears mildly fatigued, but nontoxic; speaking in full sentences without difficulty HEENT:NCAT; Ears: EACs clear, TMs pearly gray; Eyes: PERRL.  EOM grossly intact.  Sinus: TTP over maxillary sinuses; Nose: nares patent without rhinorrhea, turbinates swollen and erythematous; Throat: tonsils nonerythematous or enlarged, uvula midline  Neck: supple without LAD Lungs: clear to auscultation bilaterally without adventitious breath sounds; normal respiratory effort; mild cough present Heart: regular rate and rhythm.  Skin: warm and dry Psychological: alert and cooperative; normal mood and affect  ASSESSMENT & PLAN:  1. Acute non-recurrent maxillary sinusitis   2. Dizziness and giddiness     Meds ordered this encounter  Medications  . amoxicillin-clavulanate  (AUGMENTIN) 875-125 MG tablet    Sig: Take 1 tablet by mouth every 12 (twelve) hours for 10 days.    Dispense:  20 tablet    Refill:  0    Order Specific Question:   Supervising Provider    Answer:   Eustace Moore [[id number]]  . predniSONE (DELTASONE) 20 MG tablet    Sig: Take 1 tablet (20 mg total) by mouth 2 (two) times daily with a meal for 5 days.    Dispense:  10 tablet    Refill:  0    Order Specific Question:   Supervising Provider    Answer:   Eustace Moore [[id number]]  . dexamethasone (DECADRON) injection 10 mg   Steroid shot given in office Rest and push fluids Augmentin prescribed.  Take as directed and to completion Prednisone prescribed.  Take as directed and to completion Use OTC ibuprofen/tylenol as needed for pain Follow up with PCP for recheck Return or go to the ED if you have any new or worsening symptoms such as fever, chills, worsening sinus pain/pressure, cough, sore throat, chest pain, shortness of breath, abdominal pain, changes in bowel or bladder habits, etc...   Reviewed expectations re: course of current medical issues. Questions answered. Outlined signs and symptoms indicating need for more acute intervention. Patient verbalized understanding. After Visit Summary given.          Rennis Harding, PA-C 08/12/19 1529

## 2020-01-08 ENCOUNTER — Other Ambulatory Visit: Payer: Self-pay

## 2020-01-08 ENCOUNTER — Ambulatory Visit
Admission: EM | Admit: 2020-01-08 | Discharge: 2020-01-08 | Disposition: A | Discharge status: Home / Self Care | Payer: 59 | Attending: Emergency Medicine | Admitting: Emergency Medicine

## 2020-01-08 DIAGNOSIS — M545 Low back pain, unspecified: Secondary | ICD-10-CM

## 2020-01-08 DIAGNOSIS — G8929 Other chronic pain: Secondary | ICD-10-CM | POA: Insufficient documentation

## 2020-01-08 DIAGNOSIS — R55 Syncope and collapse: Secondary | ICD-10-CM | POA: Insufficient documentation

## 2020-01-08 LAB — POCT URINALYSIS DIP (MANUAL ENTRY)
Blood, UA: NEGATIVE
Glucose, UA: NEGATIVE mg/dL
Nitrite, UA: NEGATIVE
Protein Ur, POC: 100 mg/dL — AB
Spec Grav, UA: 1.025 (ref 1.010–1.025)
Urobilinogen, UA: 1 E.U./dL
pH, UA: 5.5 (ref 5.0–8.0)

## 2020-01-08 LAB — POCT FASTING CBG KUC MANUAL ENTRY: POCT Glucose (KUC): 140 mg/dL — AB (ref 70–99)

## 2020-01-08 MED ORDER — MELOXICAM 15 MG PO TABS: 15.0000 mg | ORAL_TABLET | Freq: Every day | ORAL | 0 refills | Status: DC

## 2020-01-08 MED ORDER — CYCLOBENZAPRINE HCL 10 MG PO TABS: 10.0000 mg | ORAL_TABLET | Freq: Every day | ORAL | 0 refills | Status: DC

## 2020-01-08 NOTE — ED Provider Notes (Signed)
[facility]   [id number] 01/08/20 Arrival Time: 1514  CC: Near syncope  SUBJECTIVE:  Raven Smith is a 49 y.o. female who presents with complaint of feeling like she was going to pass out and legs felt weak 1 hour ago.  Symptoms began after getting into the shower.  State that the intermittent with episode lasting 30-45 minutes.  Improved with rest.  Denies aggravating factors.  Admits to previous symptoms with anemia.  Complains of associated fever, tmax in office of 100.3, nausea, weight on chest, and SOB.  Denies fever, chills, vomiting, chest pain, syncope, weakness, slurred speech, memory or emotional changes, facial drooping/ asymmetry, incoordination, numbness or tingling, abdominal pain, changes in bowel or bladder habits.    Also mentions chronic back pain.   ROS: As per HPI.  All other pertinent ROS negative.    Past Medical History:  Diagnosis Date  . Arthritis   . Chronic back pain   . 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 Hyperlipidemia 2014   rx'd zocor; didn't do  . Irregular menstrual bleeding 04/23/2013   Past Surgical History:  Procedure Laterality Date  . CHOLECYSTECTOMY    . TUBAL LIGATION     No Known Allergies No current facility-administered medications on file prior to encounter.   Current Outpatient Medications on File Prior to Encounter  Medication Sig Dispense Refill  . Elagolix Sodium (ORILISSA) 150 MG TABS Take 150 mg by mouth daily. 30 tablet 6   Social History   Socioeconomic History  . Marital status: Single    Spouse name: Not on file  . Number of children: Not on file  . Years of education: Not on file  . Highest education level: Not on file  Occupational History  . Not on file  Tobacco Use  . Smoking status: Never Smoker  . Smokeless tobacco: Never Used  Vaping Use  . Vaping Use: Never used  Substance and Sexual Activity  . Alcohol use: Yes    Comment: occasional  . Drug use: No  . Sexual activity: Yes    Birth  control/protection: Surgical    Comment: tubal  Other Topics Concern  . Not on file  Social History Narrative  . Not on file   Social Determinants of Health   Financial Resource Strain:   . Difficulty of Paying Living Expenses: Not on file  Food Insecurity:   . Worried About Programme researcher, broadcasting/film/video in the Last Year: Not on file  . Ran Out of Food in the Last Year: Not on file  Transportation Needs:   . Lack of Transportation (Medical): Not on file  . Lack of Transportation (Non-Medical): Not on file  Physical Activity:   . Days of Exercise per Week: Not on file  . Minutes of Exercise per Session: Not on file  Stress:   . Feeling of Stress : Not on file  Social Connections:   . Frequency of Communication with Friends and Family: Not on file  . Frequency of Social Gatherings with Friends and Family: Not on file  . Attends Religious Services: Not on file  . Active Member of Clubs or Organizations: Not on file  . Attends Banker Meetings: Not on file  . Marital Status: Not on file  Intimate Partner Violence:   . Fear of Current or Ex-Partner: Not on file  . Emotionally Abused: Not on file  . Physically Abused: Not on file  . Sexually Abused: Not on file   Family History  Problem Relation Age of Onset  . Fibroids Mother   . Diabetes Mother   . Diabetes Father   . Diabetes Maternal Grandmother   . Heart failure Maternal Grandmother   . Other Son        MVA    OBJECTIVE:  Vitals:   01/08/20 1542  BP: 108/71  Pulse: (!) 120  Resp: 20  Temp: 100.3 F (37.9 C)  SpO2: 100%    General appearance: alert; no distress Eyes: PERRLA; EOMI; conjunctiva normal HENT: normocephalic; atraumatic; TMs normal; nasal mucosa normal; oral mucosa normal Neck: supple with FROM Lungs: clear to auscultation bilaterally Heart: tachycardic Abdomen: soft, non-tender; bowel sounds normal Skin: warm and dry Neurologic: normal gait; grip strength equal bilaterally; negative  pronator drift; finger to nose without difficulty; CN 2-12 grossly intact Psychological: alert and cooperative; normal mood and affect  Labs:  Results for orders placed or performed during the hospital encounter of 01/08/20 (from the past 24 hour(s))  POCT CBG (manual entry)     Status: Abnormal   Collection Time: 01/08/20  4:06 PM  Result Value Ref Range   POCT Glucose ([facility]) 140 (A) 70 - 99 mg/dL  POCT urinalysis dipstick     Status: Abnormal   Collection Time: 01/08/20  4:12 PM  Result Value Ref Range   Color, UA straw (A) yellow   Clarity, UA clear clear   Glucose, UA negative negative mg/dL   Bilirubin, UA small (A) negative   Ketones, POC UA small (15) (A) negative mg/dL   Spec Grav, UA 4.034 7.425 - 1.025   Blood, UA negative negative   pH, UA 5.5 5.0 - 8.0   Protein Ur, POC =100 (A) negative mg/dL   Urobilinogen, UA 1.0 0.2 or 1.0 E.U./dL   Nitrite, UA Negative Negative   Leukocytes, UA Trace (A) Negative   Orders placed or performed during the hospital encounter of 01/08/20  . ED EKG  . ED EKG    EKG:  EKG normal sinus rhythm without ST elevations, depressions, or prolonged PR interval.  No narrowing or widening of the QRS complexes.  Comparable to past EKG.    ASSESSMENT & PLAN:  1. Near syncope   2. Chronic low back pain, unspecified back pain laterality, unspecified whether sciatica present     Meds ordered this encounter  Medications  . meloxicam (MOBIC) 15 MG tablet    Sig: Take 1 tablet (15 mg total) by mouth daily.    Dispense:  30 tablet    Refill:  0    Order Specific Question:   Supervising Provider    Answer:   Eustace Moore [[id number]]  . cyclobenzaprine (FLEXERIL) 10 MG tablet    Sig: Take 1 tablet (10 mg total) by mouth at bedtime.    Dispense:  15 tablet    Refill:  0    Order Specific Question:   Supervising Provider    Answer:   Eustace Moore [[id number]]   Unable to rule out neurological disease, cardiac disease or blood clot in  urgent care setting.  Offered patient further evaluation and management in the ED.  Patient declines at this time and would like to try outpatient therapy first.  Aware of the risk associated with this decision including missed diagnosis, organ damage, organ failure, and/or death.  Patient aware and in agreement.     Push fluids and get rest We will follow up with you regarding abnormal blood work.  More than likely you will  need to follow up with PCP for further management of abnormal blood work Orthostatic vital signs reassuring EKG reassuring Urine without signs of infection.  Specific gravity of 1.025 which may be a sign of mild dehydration. Urine culture sent.  We will call you with abnormal results Blood glucose was 140 Follow up with PCP for further evaluation and management Call 911 or go to the ED for fever, chills, nausea, vomiting, chest pain, worsening SOB, abdominal pain, changes in urinary or bowel habits, passing out, facial droop, slurred speech, weakness in arms or legs, etc...  Mobic and flexeril for back pain.    Reviewed expectations re: course of current medical issues. Questions answered. Outlined signs and symptoms indicating need for more acute intervention. Patient verbalized understanding. After Visit Summary given.    Rennis Harding, PA-C 01/08/20 1647

## 2020-01-08 NOTE — ED Triage Notes (Signed)
Pt presents with c/o low back pain , pt also had a dizzy spell in shower and almost passed out, has h/o anemia  Also recently got covid shot

## 2020-01-08 NOTE — Discharge Instructions (Signed)
Unable to rule out neurological disease, cardiac disease or blood clot in urgent care setting.  Offered patient further evaluation and management in the ED.  Patient declines at this time and would like to try outpatient therapy first.  Aware of the risk associated with this decision including missed diagnosis, organ damage, organ failure, and/or death.  Patient aware and in agreement.     Push fluids and get rest We will follow up with you regarding abnormal blood work.  More than likely you will need to follow up with PCP for further management of abnormal blood work Orthostatic vital signs reassuring EKG reassuring Urine without signs of infection.  Specific gravity of 1.025 which may be a sign of mild dehydration. Urine culture sent.  We will call you with abnormal results Blood glucose was 140 Follow up with PCP for further evaluation and management Call 911 or go to the ED for fever, chills, nausea, vomiting, chest pain, worsening SOB, abdominal pain, changes in urinary or bowel habits, passing out, facial droop, slurred speech, weakness in arms or legs, etc...  Mobic and flexeril for back pain.

## 2020-01-09 ENCOUNTER — Telehealth: Payer: Self-pay | Admitting: Emergency Medicine

## 2020-01-09 ENCOUNTER — Other Ambulatory Visit: Payer: Self-pay

## 2020-01-09 ENCOUNTER — Emergency Department (HOSPITAL_COMMUNITY)
Admission: EM | Admit: 2020-01-09 | Discharge: 2020-01-09 | Disposition: A | Discharge status: Home / Self Care | Payer: 59 | Attending: Emergency Medicine | Admitting: Emergency Medicine

## 2020-01-09 ENCOUNTER — Encounter (HOSPITAL_COMMUNITY): Payer: Self-pay

## 2020-01-09 DIAGNOSIS — M549 Dorsalgia, unspecified: Secondary | ICD-10-CM | POA: Diagnosis not present

## 2020-01-09 DIAGNOSIS — R5383 Other fatigue: Secondary | ICD-10-CM | POA: Insufficient documentation

## 2020-01-09 DIAGNOSIS — Z79899 Other long term (current) drug therapy: Secondary | ICD-10-CM | POA: Diagnosis not present

## 2020-01-09 DIAGNOSIS — D649 Anemia, unspecified: Secondary | ICD-10-CM | POA: Diagnosis not present

## 2020-01-09 DIAGNOSIS — R0602 Shortness of breath: Secondary | ICD-10-CM | POA: Diagnosis not present

## 2020-01-09 HISTORY — DX: Anemia, unspecified: D64.9

## 2020-01-09 LAB — CBC WITH DIFFERENTIAL/PLATELET
Basophils Absolute: 0 10*3/uL (ref 0.0–0.2)
Basos: 0 %
EOS (ABSOLUTE): 0 10*3/uL (ref 0.0–0.4)
Eos: 0 %
Hematocrit: 24.7 % — ABNORMAL LOW (ref 34.0–46.6)
Hemoglobin: 6.9 g/dL — CL (ref 11.1–15.9)
Immature Grans (Abs): 0 10*3/uL (ref 0.0–0.1)
Immature Granulocytes: 0 %
Lymphocytes Absolute: 0.4 10*3/uL — ABNORMAL LOW (ref 0.7–3.1)
Lymphs: 5 %
MCH: 16.5 pg — ABNORMAL LOW (ref 26.6–33.0)
MCHC: 27.9 g/dL — ABNORMAL LOW (ref 31.5–35.7)
MCV: 59 fL — ABNORMAL LOW (ref 79–97)
Monocytes Absolute: 0.3 10*3/uL (ref 0.1–0.9)
Monocytes: 4 %
Neutrophils Absolute: 7.7 10*3/uL — ABNORMAL HIGH (ref 1.4–7.0)
Neutrophils: 91 %
Platelets: 288 10*3/uL (ref 150–450)
RBC: 4.18 x10E6/uL (ref 3.77–5.28)
RDW: 21 % — ABNORMAL HIGH (ref 11.7–15.4)
WBC: 8.4 10*3/uL (ref 3.4–10.8)

## 2020-01-09 LAB — CBC
HCT: 24.8 % — ABNORMAL LOW (ref 36.0–46.0)
Hemoglobin: 6.7 g/dL — CL (ref 12.0–15.0)
MCH: 16.8 pg — ABNORMAL LOW (ref 26.0–34.0)
MCHC: 27 g/dL — ABNORMAL LOW (ref 30.0–36.0)
MCV: 62 fL — ABNORMAL LOW (ref 80.0–100.0)
Platelets: 278 10*3/uL (ref 150–400)
RBC: 4 MIL/uL (ref 3.87–5.11)
RDW: 21.6 % — ABNORMAL HIGH (ref 11.5–15.5)
WBC: 4.4 10*3/uL (ref 4.0–10.5)
nRBC: 0 % (ref 0.0–0.2)

## 2020-01-09 LAB — COMPREHENSIVE METABOLIC PANEL
ALT: 10 IU/L (ref 0–32)
ALT: 20 U/L (ref 0–44)
AST: 13 IU/L (ref 0–40)
AST: 26 U/L (ref 15–41)
Albumin/Globulin Ratio: 1.4 (ref 1.2–2.2)
Albumin: 4.3 g/dL (ref 3.8–4.8)
Albumin: 4.1 g/dL (ref 3.5–5.0)
Alkaline Phosphatase: 63 IU/L (ref 48–121)
Alkaline Phosphatase: 49 U/L (ref 38–126)
Anion gap: 9 (ref 5–15)
BUN/Creatinine Ratio: 11 (ref 9–23)
BUN: 8 mg/dL (ref 6–24)
BUN: 6 mg/dL (ref 6–20)
Bilirubin Total: 0.6 mg/dL (ref 0.0–1.2)
CO2: 22 mmol/L (ref 20–29)
CO2: 21 mmol/L — ABNORMAL LOW (ref 22–32)
Calcium: 9.1 mg/dL (ref 8.7–10.2)
Calcium: 8.8 mg/dL — ABNORMAL LOW (ref 8.9–10.3)
Chloride: 103 mmol/L (ref 96–106)
Chloride: 105 mmol/L (ref 98–111)
Creatinine, Ser: 0.72 mg/dL (ref 0.57–1.00)
Creatinine, Ser: 0.63 mg/dL (ref 0.44–1.00)
GFR calc Af Amer: 115 mL/min/{1.73_m2} (ref >59)
GFR calc Af Amer: >60 mL/min (ref >60)
GFR calc non Af Amer: 100 mL/min/{1.73_m2} (ref >59)
GFR calc non Af Amer: >60 mL/min (ref >60)
Globulin, Total: 3 g/dL (ref 1.5–4.5)
Glucose, Bld: 97 mg/dL (ref 70–99)
Glucose: 144 mg/dL — ABNORMAL HIGH (ref 65–99)
Potassium: 3.8 mmol/L (ref 3.5–5.2)
Potassium: 3.2 mmol/L — ABNORMAL LOW (ref 3.5–5.1)
Sodium: 136 mmol/L (ref 134–144)
Sodium: 135 mmol/L (ref 135–145)
Total Bilirubin: 0.7 mg/dL (ref 0.3–1.2)
Total Protein: 7.3 g/dL (ref 6.0–8.5)
Total Protein: 7.6 g/dL (ref 6.5–8.1)

## 2020-01-09 LAB — URINE CULTURE: Culture: <10000 — AB

## 2020-01-09 LAB — ABO/RH: ABO/RH(D): B POS

## 2020-01-09 LAB — PREPARE RBC (CROSSMATCH)

## 2020-01-09 MED ORDER — SODIUM CHLORIDE 0.9 % IV SOLN: 10.0000 mL/h | Freq: Once | INTRAVENOUS | Status: DC

## 2020-01-09 MED ORDER — FERROUS SULFATE 325 (65 FE) MG PO TABS: 325.0000 mg | ORAL_TABLET | Freq: Every day | ORAL | 2 refills | Status: DC

## 2020-01-09 MED ORDER — IBUPROFEN 400 MG PO TABS
400.0000 mg | ORAL_TABLET | Freq: Once | ORAL | Status: AC
  Administered 2020-01-09: 400 mg via ORAL
  Filled 2020-01-09: qty 1

## 2020-01-09 NOTE — ED Notes (Signed)
Date and time results received: 01/09/20 1403  (use smartphrase ".now" to insert current time)  Test: Hemoglobin Critical Value:6.7  Name of Provider Notified: Burgess Amor PA  Orders Received? Or Actions Taken?: NA

## 2020-01-09 NOTE — Telephone Encounter (Signed)
Called pt to inform about HGb 6.9, pt states she will go to the ER at  [facility] now.

## 2020-01-09 NOTE — ED Provider Notes (Addendum)
[facility] EMERGENCY DEPARTMENT Provider Note   CSN: [id number] Arrival date & time: 01/09/20  1137     History Chief Complaint  Patient presents with  . Abnormal Lab    Raven Smith is a 49 y.o. female with a history of iron deficiency anemia, chronic back pain, hyperlipidemia and dysmenorrhea presenting for evaluation of a low hemoglobin.  She was seen at our urgent care center yesterday with complaints of weakness and episodes of near syncope yesterday which was triggered when she was in the shower.  Blood tests confirmed a hemoglobin of 6.9.  She was contacted today about this result and advised to present here.  She endorses feeling generalized fatigue for months but denies having near syncopal episodes until yesterday.  She does have shortness of breath with exertion.  She also describes heavy periods.  She states they have been "much heavier in the past", has seen her GYN regarding this concern at which time she was recommended a trial of Orilissa for regulation of her menses, but she did not start this medication.  She had been on iron supplementation in the past but has not recently. Denies GERD, bloody stools.     The history is provided by the patient.       Past Medical History:  Diagnosis Date  . Anemia   . Arthritis   . Chronic back pain   . 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 Hyperlipidemia 2014   rx'd zocor; didn't do  . Irregular menstrual bleeding 04/23/2013    Patient Active Problem List   Diagnosis Date Noted  . Dysmenorrhea 12/25/2018  . Dyspareunia in female 12/25/2018  . Chest pain 07/21/2014  . Dyspnea 07/21/2014  . Palpitations 07/21/2014  . Irregular menstrual bleeding 04/23/2013  . 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 Hyperlipidemia 2014    Past Surgical History:  Procedure Laterality Date  . CHOLECYSTECTOMY    . TUBAL LIGATION       OB History    Gravida  4   Para  2   Term      Preterm  2   AB  2   Living  1     SAB   2   TAB      Ectopic      Multiple      Live Births  2           Family History  Problem Relation Age of Onset  . Fibroids Mother   . Diabetes Mother   . Diabetes Father   . Diabetes Maternal Grandmother   . Heart failure Maternal Grandmother   . Other Son        MVA    Social History   Tobacco Use  . Smoking status: Never Smoker  . Smokeless tobacco: Never Used  Vaping Use  . Vaping Use: Never used  Substance Use Topics  . Alcohol use: Yes    Comment: occasional  . Drug use: No    Home Medications Prior to Admission medications   Medication Sig Start Date End Date Taking? Authorizing Provider  meloxicam (MOBIC) 15 MG tablet Take 1 tablet (15 mg total) by mouth daily. 01/08/20  Yes Wurst, [place], PA-C  naproxen (NAPROSYN) 375 MG tablet Take 375 mg by mouth 2 (two) times daily. 10/15/19  Yes [provider]  omeprazole (PRILOSEC) 40 MG capsule Take 40 mg by mouth 2 (two) times daily. 12/09/19  Yes [provider]  Vitamin D, Ergocalciferol, (DRISDOL) 1.25 MG (  50000 UNIT) CAPS capsule Take 1 capsule by mouth once a week. 10/02/19  Yes [provider]  cyclobenzaprine (FLEXERIL) 10 MG tablet Take 1 tablet (10 mg total) by mouth at bedtime. Patient not taking: Reported on 01/09/2020 01/08/20   Wurst, [place], PA-C  Elagolix Sodium (ORILISSA) 150 MG TABS Take 150 mg by mouth daily. Patient not taking: Reported on 01/09/2020 12/25/18   Jacklyn Shell, CNM  ferrous sulfate 325 (65 FE) MG tablet Take 1 tablet (325 mg total) by mouth daily. 01/09/20   Burgess Amor, PA-C    Allergies    Patient has no known allergies.  Review of Systems   Review of Systems  Constitutional: Positive for fatigue. Negative for chills and fever.  HENT: Negative for congestion and sore throat.   Eyes: Negative.   Respiratory: Positive for shortness of breath. Negative for chest tightness.   Cardiovascular: Negative for chest pain and palpitations.    Gastrointestinal: Negative for abdominal pain, nausea and vomiting.  Genitourinary: Negative for dysuria.  Musculoskeletal: Positive for back pain. Negative for arthralgias, joint swelling and neck pain.  Skin: Negative.  Negative for rash and wound.  Neurological: Positive for weakness. Negative for dizziness, light-headedness, numbness and headaches.       Near syncope  Psychiatric/Behavioral: Negative.     Physical Exam Updated Vital Signs BP 118/81   Pulse 82   Temp 98.1 F (36.7 C) (Oral)   Resp 19   Ht 5\' 9"  (1.753 m)   Wt 66.2 kg   LMP 11/18/2019   SpO2 100%   BMI 21.56 kg/m   Physical Exam Vitals and nursing note reviewed.  Constitutional:      Appearance: She is well-developed.  HENT:     Head: Normocephalic and atraumatic.     Mouth/Throat:     Mouth: Mucous membranes are moist.  Eyes:     Comments: Conjunctival pallor  Cardiovascular:     Rate and Rhythm: Normal rate and regular rhythm.     Heart sounds: Normal heart sounds.  Pulmonary:     Effort: Pulmonary effort is normal.     Breath sounds: Normal breath sounds. No wheezing.  Abdominal:     General: Bowel sounds are normal.     Palpations: Abdomen is soft. There is no mass.     Tenderness: There is no abdominal tenderness. There is no guarding.  Musculoskeletal:        General: Normal range of motion.     Cervical back: Normal range of motion.  Skin:    General: Skin is warm and dry.  Neurological:     General: No focal deficit present.     Mental Status: She is alert.  Psychiatric:        Mood and Affect: Mood normal.     ED Results / Procedures / Treatments   Labs (all labs ordered are listed, but only abnormal results are displayed) Labs Reviewed  COMPREHENSIVE METABOLIC PANEL - Abnormal; Notable for the following components:      Result Value   Potassium 3.2 (*)    CO2 21 (*)    Calcium 8.8 (*)    All other components within normal limits  CBC - Abnormal; Notable for the following  components:   Hemoglobin 6.7 (*)    HCT 24.8 (*)    MCV 62.0 (*)    MCH 16.8 (*)    MCHC 27.0 (*)    RDW 21.6 (*)    All other components within normal limits  TYPE AND SCREEN  ABO/RH  PREPARE RBC (CROSSMATCH)    EKG EKG Interpretation  Date/Time:  Friday January 09 2020 16:03:14 EDT Ventricular Rate:  90 PR Interval:    QRS Duration: 78 QT Interval:  333 QTC Calculation: 408 R Axis:   69 Text Interpretation: Sinus rhythm Anteroseptal infarct, age indeterminate Confirmed by Bethann Berkshire [phone]) on 01/09/2020 5:28:43 PM   Radiology No results found.  Procedures Procedures (including critical care time)  CRITICAL CARE Performed by: Burgess Amor Total critical care time: 45 minutes Critical care time was exclusive of separately billable procedures and treating other patients. Critical care was necessary to treat or prevent imminent or life-threatening deterioration. Critical care was time spent personally by me on the following activities: development of treatment plan with patient and/or surrogate as well as nursing, discussions with consultants, evaluation of patient's response to treatment, examination of patient, obtaining history from patient or surrogate, ordering and performing treatments and interventions, ordering and review of laboratory studies, ordering and review of radiographic studies, pulse oximetry and re-evaluation of patient's condition.   Medications Ordered in ED Medications  ibuprofen (ADVIL) tablet 400 mg (400 mg Oral Given 01/09/20 1907)    ED Course  I have reviewed the triage vital signs and the nursing notes.  Pertinent labs & imaging results that were available during my care of the patient were reviewed by me and considered in my medical decision making (see chart for details).    MDM Rules/Calculators/A&P                          Pt with symptomatic anemia with hgb of 6.7.  She endorses heavy menses, denies blood in stools, hemoccult  negative today.  She was given blood transfusion here, 2 units prbc's which she tolerated well.  Her anemia is microcytic.  She was started on iron supplementation.  Advised f/u with her gyn for f/u care and for a recheck hgb next week.  Also given referral to GI for consideration of endoscopy/ colonoscopy to further r/o GI source of blood loss.  Final Clinical Impression(s) / ED Diagnoses Final diagnoses:  Symptomatic anemia    Rx / DC Orders ED Discharge Orders         Ordered    ferrous sulfate 325 (65 FE) MG tablet  Daily        01/09/20 2010           Victoriano Lain 01/10/20 1230    Blane Ohara, MD 01/13/20 0745    Burgess Amor, PA-C 01/28/20 1318    Blane Ohara, MD 02/01/20 1452

## 2020-01-09 NOTE — ED Triage Notes (Signed)
Pt seen by urgent care yesterday due to back pain and black out spells. Pt has been very weak. Pt reports weakness as well

## 2020-01-09 NOTE — ED Provider Notes (Signed)
Shared service with APP.  I have personally seen and examined the patient, providing direct face to face care.  Physical exam findings and plan include patient with history of dysmenorrhea presents with low hemoglobin diagnosed outpatient.  Patient has had presyncopal and fatigue symptoms.  No significant bleeding at this time.  Patient overall well-appearing.  Plan for blood transfusion and reassessment afterwards.  No diagnosis found.     Blane Ohara, MD 01/13/20 [phone]

## 2020-01-10 LAB — BPAM RBC
Blood Product Expiration Date: 202109092359
Blood Product Expiration Date: 202109132359
ISSUE DATE / TIME: 202108271536
ISSUE DATE / TIME: 202108271837
Unit Type and Rh: 1700
Unit Type and Rh: 1700

## 2020-01-10 LAB — TYPE AND SCREEN
ABO/RH(D): B POS
Antibody Screen: NEGATIVE
Unit division: 0
Unit division: 0

## 2020-01-13 ENCOUNTER — Telehealth: Payer: Self-pay

## 2020-01-13 NOTE — Telephone Encounter (Signed)
Er referral  ?

## 2020-01-14 NOTE — Telephone Encounter (Signed)
CALLED PATIENT AND SCHEDULED HER

## 2020-01-14 NOTE — Telephone Encounter (Signed)
Ok to schedule ov.  

## 2020-01-15 ENCOUNTER — Encounter: Payer: Self-pay | Admitting: Nurse Practitioner

## 2020-01-15 ENCOUNTER — Ambulatory Visit (INDEPENDENT_AMBULATORY_CARE_PROVIDER_SITE_OTHER): Payer: 59 | Admitting: Nurse Practitioner

## 2020-01-15 ENCOUNTER — Other Ambulatory Visit: Payer: Self-pay

## 2020-01-15 DIAGNOSIS — K5900 Constipation, unspecified: Secondary | ICD-10-CM | POA: Insufficient documentation

## 2020-01-15 DIAGNOSIS — D500 Iron deficiency anemia secondary to blood loss (chronic): Secondary | ICD-10-CM

## 2020-01-15 DIAGNOSIS — K219 Gastro-esophageal reflux disease without esophagitis: Secondary | ICD-10-CM | POA: Diagnosis not present

## 2020-01-15 DIAGNOSIS — D649 Anemia, unspecified: Secondary | ICD-10-CM | POA: Insufficient documentation

## 2020-01-15 NOTE — H&P (View-Only) (Signed)
Primary Care Physician:  [facility] Primary Gastroenterologist:  Dr. Abbey Chatters  Chief Complaint  Patient presents with  . Anemia    HPI:   Raven Smith is a 49 y.o. female who presents in referral from the emergency department for evaluation of anemia.  The patient was seen in the ED 01/09/2020 for symptomatic anemia.  At that time noted history of IDA, hyperlipidemia, and dysmenorrhea.  Seen in urgent care with complaints of weakness and near syncope with blood test confirming hemoglobin 6.9.  Noted generalized fatigue for months but denies near syncopal episode until yesterday.  Admits dyspnea on exertion.  Has heavy periods although they have been "much heavier in the past" and is seeing her gynecologist for this in which point she was recommended a trial of Orilissa to regulate her menses but has not started it.  She has been on iron supplements in the past but not recently.  Denies GERD or bloody stools.  In the ED confirmed hemoglobin 6.7 which was microcytic and hypochromic.  She was Hemoccult negative.  She received 2 units of PRBCs.  Started on iron supplement.  Recommended to follow-up with gynecology and a referral was also made to GI to consider possible endoscopy/colonoscopy to further rule out GI source of blood loss.  No history of colonoscopy or endoscopy in our system.  Today she states she is doing okay overall. She admits history of heavy menses but feels it has been slowing recently. She hasn't started the [place] yet. She has some intermittent abdominal pain in the mid abdomen, intermittent, described as almost sharp and "just a really uncomfortable feeling." Pain occurs about every other week, lasts about 30-60 minutes. Possible trigger of constipation. She has a bowel movement every other day, usually consistent of [place] 4-5. History of cholecystectomy in the past. Does have some on and off constipation but tries to manage this on her own. Has  "terrible" GERD symptoms with esophageal burning and water brash. Symptoms about every day, more in the evenings, typically after eating. She does consume typical triggers like spicy, greasy foods. She is on omeprazole bid which helps a little bit, but not much. Also takes [place]. Takes naprosyn occasionally. Also on Mobic, but hasn't been taking medications much recently. Denies persistent N/V, heamtochezia, melena, fever, chills, unintentional weight loss. Denies URI or flu-like symptoms. Denies loss of sense of taste or smell. The patient has received COVID-19 vaccination(s). Denies chest pain, dizziness, lightheadedness, syncope, near syncope. Denies any other upper or lower GI symptoms.  DOE is improved since transfusion but not resolved. No recurrent near syncope.  She states she knows she has neglected her health since she lost her son in a car accident almost a year ago. Has been having a significant grieving since then.  Past Medical History:  Diagnosis Date  . Anemia   . Arthritis   . Chronic back pain   . 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 Hyperlipidemia 2014   rx'd zocor; didn't do  . Irregular menstrual bleeding 04/23/2013    Past Surgical History:  Procedure Laterality Date  . CHOLECYSTECTOMY    . TUBAL LIGATION      Current Outpatient Medications  Medication Sig Dispense Refill  . ferrous sulfate 325 (65 FE) MG tablet Take 1 tablet (325 mg total) by mouth daily. 30 tablet 2  . meloxicam (MOBIC) 15 MG tablet Take 1 tablet (15 mg total) by mouth daily. 30 tablet 0  . naproxen (NAPROSYN) 375  MG tablet Take 375 mg by mouth 2 (two) times daily.    Marland Kitchen omeprazole (PRILOSEC) 40 MG capsule Take 40 mg by mouth 2 (two) times daily.    . Vitamin D, Ergocalciferol, (DRISDOL) 1.25 MG (50000 UNIT) CAPS capsule Take 1 capsule by mouth once a week.    . cyclobenzaprine (FLEXERIL) 10 MG tablet Take 1 tablet (10 mg total) by mouth at bedtime. (Patient not taking: Reported on 01/09/2020) 15  tablet 0  . Elagolix Sodium (ORILISSA) 150 MG TABS Take 150 mg by mouth daily. (Patient not taking: Reported on 01/09/2020) 30 tablet 6   No current facility-administered medications for this visit.    Allergies as of 01/15/2020  . (No Known Allergies)    Family History  Problem Relation Age of Onset  . Fibroids Mother   . Diabetes Mother   . Diabetes Father   . Diabetes Maternal Grandmother   . Heart failure Maternal Grandmother   . Other Son        MVA    Social History   Socioeconomic History  . Marital status: Single    Spouse name: Not on file  . Number of children: Not on file  . Years of education: Not on file  . Highest education level: Not on file  Occupational History  . Not on file  Tobacco Use  . Smoking status: Never Smoker  . Smokeless tobacco: Never Used  Vaping Use  . Vaping Use: Never used  Substance and Sexual Activity  . Alcohol use: Yes    Comment: occasional  . Drug use: No  . Sexual activity: Yes    Birth control/protection: Surgical    Comment: tubal  Other Topics Concern  . Not on file  Social History Narrative  . Not on file   Social Determinants of Health   Financial Resource Strain:   . Difficulty of Paying Living Expenses: Not on file  Food Insecurity:   . Worried About Charity fundraiser in the Last Year: Not on file  . Ran Out of Food in the Last Year: Not on file  Transportation Needs:   . Lack of Transportation (Medical): Not on file  . Lack of Transportation (Non-Medical): Not on file  Physical Activity:   . Days of Exercise per Week: Not on file  . Minutes of Exercise per Session: Not on file  Stress:   . Feeling of Stress : Not on file  Social Connections:   . Frequency of Communication with Friends and Family: Not on file  . Frequency of Social Gatherings with Friends and Family: Not on file  . Attends Religious Services: Not on file  . Active Member of Clubs or Organizations: Not on file  . Attends Theatre manager Meetings: Not on file  . Marital Status: Not on file  Intimate Partner Violence:   . Fear of Current or Ex-Partner: Not on file  . Emotionally Abused: Not on file  . Physically Abused: Not on file  . Sexually Abused: Not on file    Subjective: Review of Systems  Constitutional: Negative for chills, fever, malaise/fatigue and weight loss.  HENT: Negative for congestion and sore throat.   Respiratory: Negative for cough and shortness of breath.   Cardiovascular: Negative for chest pain and palpitations.  Gastrointestinal: Negative for abdominal pain, blood in stool, diarrhea, melena, nausea and vomiting.  Musculoskeletal: Negative for joint pain and myalgias.  Skin: Negative for rash.  Neurological: Negative for dizziness and weakness.  Endo/Heme/Allergies: Does not bruise/bleed easily.  Psychiatric/Behavioral: Negative for depression. The patient is not nervous/anxious.   All other systems reviewed and are negative.      Objective: BP 107/74   Pulse 85   Temp (!) 97.1 F (36.2 C) (Temporal)   Ht _0  (1.753 m)   Wt 152 lb 9.6 oz (69.2 kg)   LMP 12/19/2019 (Approximate)   BMI 22.54 kg/m  Physical Exam Vitals and nursing note reviewed.  Constitutional:      General: She is not in acute distress.    Appearance: Normal appearance. She is well-developed. She is not ill-appearing, toxic-appearing or diaphoretic.  HENT:     Head: Normocephalic and atraumatic.     Nose: No congestion or rhinorrhea.  Eyes:     General: No scleral icterus. Cardiovascular:     Rate and Rhythm: Normal rate and regular rhythm.     Heart sounds: Normal heart sounds.  Pulmonary:     Effort: Pulmonary effort is normal. No respiratory distress.     Breath sounds: Normal breath sounds.  Abdominal:     General: Bowel sounds are normal.     Palpations: Abdomen is soft. There is no hepatomegaly, splenomegaly or mass.     Tenderness: There is no abdominal tenderness. There is no guarding  or rebound.     Hernia: No hernia is present.  Skin:    General: Skin is warm and dry.     Coloration: Skin is not jaundiced.     Findings: No rash.  Neurological:     General: No focal deficit present.     Mental Status: She is alert and oriented to person, place, and time.  Psychiatric:        Attention and Perception: Attention normal.        Mood and Affect: Mood normal.        Speech: Speech normal.        Behavior: Behavior normal.        Thought Content: Thought content normal.        Cognition and Memory: Cognition and memory normal.      Assessment:  Very pleasant 49 year old female who presents on referral from the emergency department for anemia.  She does have a history of heavy menses which is likely contributory.  Longstanding history of likely IDA, although her baseline hemoglobin in the past 5 to 7 years has been in the 11-range.  She presented to the ED a few days ago with a hemoglobin of 6.9 and was given 2 units of PRBCs.  She was referred to GI for evaluation for possible GI contributing causes, although her heme stool test was negative  During our discussion it was discovered she is on omeprazole 40 mg twice daily and has significant GERD symptoms on a near daily basis.  She states her PPI has helped a little but not much.  We may need to switch to a different PPI agent to see if we get any better information.  She also needs GERD education as well.  This also justifies EGD for evaluation of cause of bleeding and/or persistent GERD despite PPI, would likely benefit from a biopsy for H. Pylori  In addition to her acute on chronic anemia, she is 49 years old African-American female and is due for colon cancer screening regardless.  This further supports need for colonoscopy to evaluate for GI cause of bleeding  Overall I think her anemia is likely multifactorial in nature, if there is  a GI etiology I would guess more upper GI versus lower GI given her persistent GERD  symptoms and NSAID use as per HPI.  Further recommendations will follow her procedure.  I will update her labs as well status post transfusion.  Proceed with TCS and EGD (consider H. pylori biopsy) with Dr. Abbey Chatters on propofol/MAC in near future: the risks, benefits, and alternatives have been discussed with the patient in detail. The patient states understanding and desires to proceed.  ASA I  Childbearing age still with menses.  Will need a urine pregnancy test preprocedure   Plan: 1. CBC, iron panel 2. EGD and colonoscopy; consider H. pylori biopsy 3. Stop omeprazole and start Protonix 40 mg twice daily 4. GERD printed information 5. Follow-up in 3 months 6. Call us for any worsening or severe symptoms      01/15/2020 9:05 AM   Disclaimer: This note was dictated with voice recognition software. Similar sounding words can inadvertently be transcribed and may not be corrected upon review.

## 2020-01-15 NOTE — Progress Notes (Signed)
Cc'ed to pcp °

## 2020-01-15 NOTE — Progress Notes (Signed)
Primary Care Physician:  [facility] Primary Gastroenterologist:  Dr. Abbey Chatters  Chief Complaint  Patient presents with  . Anemia    HPI:   Raven Smith is a 49 y.o. female who presents in referral from the emergency department for evaluation of anemia.  The patient was seen in the ED 01/09/2020 for symptomatic anemia.  At that time noted history of IDA, hyperlipidemia, and dysmenorrhea.  Seen in urgent care with complaints of weakness and near syncope with blood test confirming hemoglobin 6.9.  Noted generalized fatigue for months but denies near syncopal episode until yesterday.  Admits dyspnea on exertion.  Has heavy periods although they have been "much heavier in the past" and is seeing her gynecologist for this in which point she was recommended a trial of [place] to regulate her menses but has not started it.  She has been on iron supplements in the past but not recently.  Denies GERD or bloody stools.  In the ED confirmed hemoglobin 6.7 which was microcytic and hypochromic.  She was Hemoccult negative.  She received 2 units of PRBCs.  Started on iron supplement.  Recommended to follow-up with gynecology and a referral was also made to GI to consider possible endoscopy/colonoscopy to further rule out GI source of blood loss.  No history of colonoscopy or endoscopy in our system.  Today she states she is doing okay overall. She admits history of heavy menses but feels it has been slowing recently. She hasn't started the [place] yet. She has some intermittent abdominal pain in the mid abdomen, intermittent, described as almost sharp and "just a really uncomfortable feeling." Pain occurs about every other week, lasts about 30-60 minutes. Possible trigger of constipation. She has a bowel movement every other day, usually consistent of [place] 4-5. History of cholecystectomy in the past. Does have some on and off constipation but tries to manage this on her own. Has  "terrible" GERD symptoms with esophageal burning and water brash. Symptoms about every day, more in the evenings, typically after eating. She does consume typical triggers like spicy, greasy foods. She is on omeprazole bid which helps a little bit, but not much. Also takes [place]. Takes naprosyn occasionally. Also on Mobic, but hasn't been taking medications much recently. Denies persistent N/V, heamtochezia, melena, fever, chills, unintentional weight loss. Denies URI or flu-like symptoms. Denies loss of sense of taste or smell. The patient has received COVID-19 vaccination(s). Denies chest pain, dizziness, lightheadedness, syncope, near syncope. Denies any other upper or lower GI symptoms.  DOE is improved since transfusion but not resolved. No recurrent near syncope.  She states she knows she has neglected her health since she lost her son in a car accident almost a year ago. Has been having a significant grieving since then.  Past Medical History:  Diagnosis Date  . Anemia   . Arthritis   . Chronic back pain   . 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 Hyperlipidemia 2014   rx'd zocor; didn't do  . Irregular menstrual bleeding 04/23/2013    Past Surgical History:  Procedure Laterality Date  . CHOLECYSTECTOMY    . TUBAL LIGATION      Current Outpatient Medications  Medication Sig Dispense Refill  . ferrous sulfate 325 (65 FE) MG tablet Take 1 tablet (325 mg total) by mouth daily. 30 tablet 2  . meloxicam (MOBIC) 15 MG tablet Take 1 tablet (15 mg total) by mouth daily. 30 tablet 0  . naproxen (NAPROSYN) 375  MG tablet Take 375 mg by mouth 2 (two) times daily.    Marland Kitchen omeprazole (PRILOSEC) 40 MG capsule Take 40 mg by mouth 2 (two) times daily.    . Vitamin D, Ergocalciferol, (DRISDOL) 1.25 MG (50000 UNIT) CAPS capsule Take 1 capsule by mouth once a week.    . cyclobenzaprine (FLEXERIL) 10 MG tablet Take 1 tablet (10 mg total) by mouth at bedtime. (Patient not taking: Reported on 01/09/2020) 15  tablet 0  . Elagolix Sodium (ORILISSA) 150 MG TABS Take 150 mg by mouth daily. (Patient not taking: Reported on 01/09/2020) 30 tablet 6   No current facility-administered medications for this visit.    Allergies as of 01/15/2020  . (No Known Allergies)    Family History  Problem Relation Age of Onset  . Fibroids Mother   . Diabetes Mother   . Diabetes Father   . Diabetes Maternal Grandmother   . Heart failure Maternal Grandmother   . Other Son        MVA    Social History   Socioeconomic History  . Marital status: Single    Spouse name: Not on file  . Number of children: Not on file  . Years of education: Not on file  . Highest education level: Not on file  Occupational History  . Not on file  Tobacco Use  . Smoking status: Never Smoker  . Smokeless tobacco: Never Used  Vaping Use  . Vaping Use: Never used  Substance and Sexual Activity  . Alcohol use: Yes    Comment: occasional  . Drug use: No  . Sexual activity: Yes    Birth control/protection: Surgical    Comment: tubal  Other Topics Concern  . Not on file  Social History Narrative  . Not on file   Social Determinants of Health   Financial Resource Strain:   . Difficulty of Paying Living Expenses: Not on file  Food Insecurity:   . Worried About Charity fundraiser in the Last Year: Not on file  . Ran Out of Food in the Last Year: Not on file  Transportation Needs:   . Lack of Transportation (Medical): Not on file  . Lack of Transportation (Non-Medical): Not on file  Physical Activity:   . Days of Exercise per Week: Not on file  . Minutes of Exercise per Session: Not on file  Stress:   . Feeling of Stress : Not on file  Social Connections:   . Frequency of Communication with Friends and Family: Not on file  . Frequency of Social Gatherings with Friends and Family: Not on file  . Attends Religious Services: Not on file  . Active Member of Clubs or Organizations: Not on file  . Attends Theatre manager Meetings: Not on file  . Marital Status: Not on file  Intimate Partner Violence:   . Fear of Current or Ex-Partner: Not on file  . Emotionally Abused: Not on file  . Physically Abused: Not on file  . Sexually Abused: Not on file    Subjective: Review of Systems  Constitutional: Negative for chills, fever, malaise/fatigue and weight loss.  HENT: Negative for congestion and sore throat.   Respiratory: Negative for cough and shortness of breath.   Cardiovascular: Negative for chest pain and palpitations.  Gastrointestinal: Negative for abdominal pain, blood in stool, diarrhea, melena, nausea and vomiting.  Musculoskeletal: Negative for joint pain and myalgias.  Skin: Negative for rash.  Neurological: Negative for dizziness and weakness.  Endo/Heme/Allergies: Does not bruise/bleed easily.  Psychiatric/Behavioral: Negative for depression. The patient is not nervous/anxious.   All other systems reviewed and are negative.      Objective: BP 107/74   Pulse 85   Temp (!) 97.1 F (36.2 C) (Temporal)   Ht _0  (1.753 m)   Wt 152 lb 9.6 oz (69.2 kg)   LMP 12/19/2019 (Approximate)   BMI 22.54 kg/m  Physical Exam Vitals and nursing note reviewed.  Constitutional:      General: She is not in acute distress.    Appearance: Normal appearance. She is well-developed. She is not ill-appearing, toxic-appearing or diaphoretic.  HENT:     Head: Normocephalic and atraumatic.     Nose: No congestion or rhinorrhea.  Eyes:     General: No scleral icterus. Cardiovascular:     Rate and Rhythm: Normal rate and regular rhythm.     Heart sounds: Normal heart sounds.  Pulmonary:     Effort: Pulmonary effort is normal. No respiratory distress.     Breath sounds: Normal breath sounds.  Abdominal:     General: Bowel sounds are normal.     Palpations: Abdomen is soft. There is no hepatomegaly, splenomegaly or mass.     Tenderness: There is no abdominal tenderness. There is no guarding  or rebound.     Hernia: No hernia is present.  Skin:    General: Skin is warm and dry.     Coloration: Skin is not jaundiced.     Findings: No rash.  Neurological:     General: No focal deficit present.     Mental Status: She is alert and oriented to person, place, and time.  Psychiatric:        Attention and Perception: Attention normal.        Mood and Affect: Mood normal.        Speech: Speech normal.        Behavior: Behavior normal.        Thought Content: Thought content normal.        Cognition and Memory: Cognition and memory normal.      Assessment:  Very pleasant 49 year old female who presents on referral from the emergency department for anemia.  She does have a history of heavy menses which is likely contributory.  Longstanding history of likely IDA, although her baseline hemoglobin in the past 5 to 7 years has been in the 11-range.  She presented to the ED a few days ago with a hemoglobin of 6.9 and was given 2 units of PRBCs.  She was referred to GI for evaluation for possible GI contributing causes, although her heme stool test was negative  During our discussion it was discovered she is on omeprazole 40 mg twice daily and has significant GERD symptoms on a near daily basis.  She states her PPI has helped a little but not much.  We may need to switch to a different PPI agent to see if we get any better information.  She also needs GERD education as well.  This also justifies EGD for evaluation of cause of bleeding and/or persistent GERD despite PPI, would likely benefit from a biopsy for H. Pylori  In addition to her acute on chronic anemia, she is 49 years old African-American female and is due for colon cancer screening regardless.  This further supports need for colonoscopy to evaluate for GI cause of bleeding  Overall I think her anemia is likely multifactorial in nature, if there is  a GI etiology I would guess more upper GI versus lower GI given her persistent GERD  symptoms and NSAID use as per HPI.  Further recommendations will follow her procedure.  I will update her labs as well status post transfusion.  Proceed with TCS and EGD (consider H. pylori biopsy) with Dr. Abbey Chatters on propofol/MAC in near future: the risks, benefits, and alternatives have been discussed with the patient in detail. The patient states understanding and desires to proceed.  ASA I  Childbearing age still with menses.  Will need a urine pregnancy test preprocedure   Plan: 1. CBC, iron panel 2. EGD and colonoscopy; consider H. pylori biopsy 3. Stop omeprazole and start Protonix 40 mg twice daily 4. GERD printed information 5. Follow-up in 3 months 6. Call us for any worsening or severe symptoms      01/15/2020 9:05 AM   Disclaimer: This note was dictated with voice recognition software. Similar sounding words can inadvertently be transcribed and may not be corrected upon review.

## 2020-01-15 NOTE — Patient Instructions (Addendum)
Your health issues we discussed today were:   GERD (reflux/heartburn): 1. Stop taking omeprazole 2. I sent a prescription to your pharmacy for Protonix 40 mg twice daily.  Take this pursing in the morning on an empty stomach and 30 minutes for your last meal the day 3. I printed some information below about the GERD and foods to avoid 4. We will further evaluate for why you are still having reflux despite your medications with an EGD, as discussed below 5. Call us if you have any worsening or severe symptoms  Constipation with abdominal pain: 1. On day that you are constipated you can use a stool softener such as Colace 100 mg or MiraLAX 1 capful 2. Call us if you have worsening constipation or abdominal pain  Anemia likely due to chronic blood loss: 1. As we discussed, there are likely multiple causes behind your blood loss including your heavy menses 2. However, given your worsening/significant heartburn and your age we will proceed with a colonoscopy and upper endoscopy to further evaluate 3. Further recommendations will be made after your procedures 4. Have your labs drawn as soon as you can 5. Call us if you have any worsening fatigue/weakness, obvious blood in your stools or pitch black stools  Overall I recommend:  1. Continue your other current medications 2. Return for follow-up in 3 months 3. Call us for any questions or concerns   ---------------------------------------------------------------  I am glad you have gotten your COVID-19 vaccination!  Even though you are fully vaccinated you should continue to follow CDC and state/local guidelines.  ---------------------------------------------------------------   At [facility] we value your feedback. You may receive a survey about your visit today. Please share your experience as we strive to create trusting relationships with our patients to provide genuine, compassionate, quality care.  We appreciate your  understanding and patience as we review any laboratory studies, imaging, and other diagnostic tests that are ordered as we care for you. Our office policy is 5 business days for review of these results, and any emergent or urgent results are addressed in a timely manner for your best interest. If you do not hear from our office in 1 week, please contact us.   We also encourage the use of MyChart, which contains your medical information for your review as well. If you are not enrolled in this feature, an access code is on this after visit summary for your convenience. Thank you for allowing [place] to be involved in your care.  It was great to see you today!  I hope you have a great rest of your summer and congratulations on your upcoming wedding!!        Gastroesophageal Reflux Disease, Adult Gastroesophageal reflux (GER) happens when acid from the stomach flows up into the tube that connects the mouth and the stomach (esophagus). Normally, food travels down the esophagus and stays in the stomach to be digested. However, when a person has GER, food and stomach acid sometimes move back up into the esophagus. If this becomes a more serious problem, the person may be diagnosed with a disease called gastroesophageal reflux disease (GERD). GERD occurs when the reflux:  Happens often.  Causes frequent or severe symptoms.  Causes problems such as damage to the esophagus. When stomach acid comes in contact with the esophagus, the acid may cause soreness (inflammation) in the esophagus. Over time, GERD may create small holes (ulcers) in the lining of the esophagus. What are the causes? This condition is  caused by a problem with the muscle between the esophagus and the stomach (lower esophageal sphincter, or LES). Normally, the LES muscle closes after food passes through the esophagus to the stomach. When the LES is weakened or abnormal, it does not close properly, and that allows food and stomach acid to go  back up into the esophagus. The LES can be weakened by certain dietary substances, medicines, and medical conditions, including:  Tobacco use.  Pregnancy.  Having a hiatal hernia.  Alcohol use.  Certain foods and beverages, such as coffee, chocolate, onions, and peppermint. What increases the risk? You are more likely to develop this condition if you:  Have an increased body weight.  Have a connective tissue disorder.  Use NSAID medicines. What are the signs or symptoms? Symptoms of this condition include:  Heartburn.  Difficult or painful swallowing.  The feeling of having a lump in the throat.  Abitter taste in the mouth.  Bad breath.  Having a large amount of saliva.  Having an upset or bloated stomach.  Belching.  Chest pain. Different conditions can cause chest pain. Make sure you see your health care provider if you experience chest pain.  Shortness of breath or wheezing.  Ongoing (chronic) cough or a night-time cough.  Wearing away of tooth enamel.  Weight loss. How is this diagnosed? Your health care provider will take a medical history and perform a physical exam. To determine if you have mild or severe GERD, your health care provider may also monitor how you respond to treatment. You may also have tests, including:  A test to examine your stomach and esophagus with a small camera (endoscopy).  A test thatmeasures the acidity level in your esophagus.  A test thatmeasures how much pressure is on your esophagus.  A barium swallow or modified barium swallow test to show the shape, size, and functioning of your esophagus. How is this treated? The goal of treatment is to help relieve your symptoms and to prevent complications. Treatment for this condition may vary depending on how severe your symptoms are. Your health care provider may recommend:  Changes to your diet.  Medicine.  Surgery. Follow these instructions at home: Eating and  drinking   Follow a diet as recommended by your health care provider. This may involve avoiding foods and drinks such as: ? Coffee and tea (with or without caffeine). ? Drinks that containalcohol. ? Energy drinks and sports drinks. ? Carbonated drinks or sodas. ? Chocolate and cocoa. ? Peppermint and mint flavorings. ? Garlic and onions. ? Horseradish. ? Spicy and acidic foods, including peppers, chili powder, curry powder, vinegar, hot sauces, and barbecue sauce. ? Citrus fruit juices and citrus fruits, such as oranges, lemons, and limes. ? Tomato-based foods, such as red sauce, chili, salsa, and pizza with red sauce. ? Fried and fatty foods, such as donuts, french fries, potato chips, and high-fat dressings. ? High-fat meats, such as hot dogs and fatty cuts of red and white meats, such as rib eye steak, sausage, ham, and bacon. ? High-fat dairy items, such as whole milk, butter, and cream cheese.  Eat small, frequent meals instead of large meals.  Avoid drinking large amounts of liquid with your meals.  Avoid eating meals during the 2-3 hours before bedtime.  Avoid lying down right after you eat.  Do not exercise right after you eat. Lifestyle   Do not use any products that contain nicotine or tobacco, such as cigarettes, e-cigarettes, and chewing tobacco. If  you need help quitting, ask your health care provider.  Try to reduce your stress by using methods such as yoga or meditation. If you need help reducing stress, ask your health care provider.  If you are overweight, reduce your weight to an amount that is healthy for you. Ask your health care provider for guidance about a safe weight loss goal. General instructions  Pay attention to any changes in your symptoms.  Take over-the-counter and prescription medicines only as told by your health care provider. Do not take aspirin, ibuprofen, or other NSAIDs unless your health care provider told you to do so.  Wear  loose-fitting clothing. Do not wear anything tight around your waist that causes pressure on your abdomen.  Raise (elevate) the head of your bed about 6 inches (15 cm).  Avoid bending over if this makes your symptoms worse.  Keep all follow-up visits as told by your health care provider. This is important. Contact a health care provider if:  You have: ? New symptoms. ? Unexplained weight loss. ? Difficulty swallowing or it hurts to swallow. ? Wheezing or a persistent cough. ? A hoarse voice.  Your symptoms do not improve with treatment. Get help right away if you:  Have pain in your arms, neck, jaw, teeth, or back.  Feel sweaty, dizzy, or light-headed.  Have chest pain or shortness of breath.  Vomit and your vomit looks like blood or coffee grounds.  Faint.  Have stool that is bloody or black.  Cannot swallow, drink, or eat. Summary  Gastroesophageal reflux happens when acid from the stomach flows up into the esophagus. GERD is a disease in which the reflux happens often, causes frequent or severe symptoms, or causes problems such as damage to the esophagus.  Treatment for this condition may vary depending on how severe your symptoms are. Your health care provider may recommend diet and lifestyle changes, medicine, or surgery.  Contact a health care provider if you have new or worsening symptoms.  Take over-the-counter and prescription medicines only as told by your health care provider. Do not take aspirin, ibuprofen, or other NSAIDs unless your health care provider told you to do so.  Keep all follow-up visits as told by your health care provider. This is important. This information is not intended to replace advice given to you by your health care provider. Make sure you discuss any questions you have with your health care provider. Document Revised: 11/07/2017 Document Reviewed: 11/07/2017 Elsevier Patient Education  2020 [employer]    Food Choices for  Gastroesophageal Reflux Disease, Adult When you have gastroesophageal reflux disease (GERD), the foods you eat and your eating habits are very important. Choosing the right foods can help ease the discomfort of GERD. Consider working with a diet and nutrition specialist (dietitian) to help you make healthy food choices. What general guidelines should I follow?  Eating plan  Choose healthy foods low in fat, such as fruits, vegetables, whole grains, low-fat dairy products, and lean meat, fish, and poultry.  Eat frequent, small meals instead of three large meals each day. Eat your meals slowly, in a relaxed setting. Avoid bending over or lying down until 2-3 hours after eating.  Limit high-fat foods such as fatty meats or fried foods.  Limit your intake of oils, butter, and shortening to less than 8 teaspoons each day.  Avoid the following: ? Foods that cause symptoms. These may be different for different people. Keep a food diary to keep track of  foods that cause symptoms. ? Alcohol. ? Drinking large amounts of liquid with meals. ? Eating meals during the 2-3 hours before bed.  Cook foods using methods other than frying. This may include baking, grilling, or broiling. Lifestyle  Maintain a healthy weight. Ask your health care provider what weight is healthy for you. If you need to lose weight, work with your health care provider to do so safely.  Exercise for at least 30 minutes on 5 or more days each week, or as told by your health care provider.  Avoid wearing clothes that fit tightly around your waist and chest.  Do not use any products that contain nicotine or tobacco, such as cigarettes and e-cigarettes. If you need help quitting, ask your health care provider.  Sleep with the head of your bed raised. Use a wedge under the mattress or blocks under the bed frame to raise the head of the bed. What foods are not recommended? The items listed may not be a complete list. Talk with your  dietitian about what dietary choices are best for you. Grains Pastries or quick breads with added fat. [place] toast. Vegetables Deep fried vegetables. [place] fries. Any vegetables prepared with added fat. Any vegetables that cause symptoms. For some people this may include tomatoes and tomato products, chili peppers, onions and garlic, and horseradish. Fruits Any fruits prepared with added fat. Any fruits that cause symptoms. For some people this may include citrus fruits, such as oranges, grapefruit, pineapple, and lemons. Meats and other protein foods High-fat meats, such as fatty beef or pork, hot dogs, ribs, ham, sausage, salami and bacon. Fried meat or protein, including fried fish and fried chicken. Nuts and nut butters. Dairy Whole milk and chocolate milk. Sour cream. Cream. Ice cream. Cream cheese. Milk shakes. Beverages Coffee and tea, with or without caffeine. Carbonated beverages. Sodas. Energy drinks. Fruit juice made with acidic fruits (such as orange or grapefruit). Tomato juice. Alcoholic drinks. Fats and oils Butter. Margarine. Shortening. Ghee. Sweets and desserts Chocolate and cocoa. Donuts. Seasoning and other foods Pepper. Peppermint and spearmint. Any condiments, herbs, or seasonings that cause symptoms. For some people, this may include curry, hot sauce, or vinegar-based salad dressings. Summary  When you have gastroesophageal reflux disease (GERD), food and lifestyle choices are very important to help ease the discomfort of GERD.  Eat frequent, small meals instead of three large meals each day. Eat your meals slowly, in a relaxed setting. Avoid bending over or lying down until 2-3 hours after eating.  Limit high-fat foods such as fatty meat or fried foods. This information is not intended to replace advice given to you by your health care provider. Make sure you discuss any questions you have with your health care provider. Document Revised: 08/22/2018 Document  Reviewed: 05/02/2016 Elsevier Patient Education  2020 [employer]. Very pleasant

## 2020-01-16 LAB — CBC WITH DIFFERENTIAL/PLATELET
Absolute Monocytes: 281 cells/uL (ref 200–950)
Basophils Absolute: 21 cells/uL (ref 0–200)
Basophils Relative: 0.4 %
Eosinophils Absolute: 21 cells/uL (ref 15–500)
Eosinophils Relative: 0.4 %
HCT: 34.5 % — ABNORMAL LOW (ref 35.0–45.0)
Hemoglobin: 10.3 g/dL — ABNORMAL LOW (ref 11.7–15.5)
Lymphs Abs: 1219 cells/uL (ref 850–3900)
MCH: 19.9 pg — ABNORMAL LOW (ref 27.0–33.0)
MCHC: 29.9 g/dL — ABNORMAL LOW (ref 32.0–36.0)
MCV: 66.7 fL — ABNORMAL LOW (ref 80.0–100.0)
MPV: 9.5 fL (ref 7.5–12.5)
Monocytes Relative: 5.3 %
Neutro Abs: 3758 cells/uL (ref 1500–7800)
Neutrophils Relative %: 70.9 %
Platelets: 297 10*3/uL (ref 140–400)
RBC: 5.17 10*6/uL — ABNORMAL HIGH (ref 3.80–5.10)
RDW: 28.7 % — ABNORMAL HIGH (ref 11.0–15.0)
Total Lymphocyte: 23 %
WBC: 5.3 10*3/uL (ref 3.8–10.8)

## 2020-01-16 LAB — CBC MORPHOLOGY

## 2020-01-16 LAB — IRON: Iron: 25 ug/dL — ABNORMAL LOW (ref 40–190)

## 2020-01-16 LAB — FERRITIN: Ferritin: 15 ng/mL — ABNORMAL LOW (ref 16–232)

## 2020-01-26 ENCOUNTER — Telehealth: Payer: Self-pay | Admitting: Internal Medicine

## 2020-01-26 NOTE — Telephone Encounter (Signed)
[phone]  PLEASE CALL PATIENT SHE HAD A QUESTION ABOUT HER LABS ID THEY ARE COMPLETE.

## 2020-01-26 NOTE — Telephone Encounter (Signed)
Pt is inquiring about her lab results from 01/15/20. Pt was concerned since she previously had to have a blood transfusion.

## 2020-01-26 NOTE — Telephone Encounter (Signed)
Eric to address tomorrow.

## 2020-01-26 NOTE — Telephone Encounter (Signed)
Lab Results  Component Value Date   WBC 5.3 01/15/2020   HGB 10.3 (L) 01/15/2020   HCT 34.5 (L) 01/15/2020   MCV 66.7 (L) 01/15/2020   PLT 297 01/15/2020   Please let her know that her Hgb is much improved to 10.3. Further results per Minerva Areola when he returns.

## 2020-01-26 NOTE — Telephone Encounter (Signed)
Noted. Pt is aware that her Hgb is much improved and further results will come from Wynne Dust, NP.

## 2020-01-27 NOTE — Telephone Encounter (Signed)
See result note fur further recommendations.

## 2020-02-06 ENCOUNTER — Other Ambulatory Visit: Payer: Self-pay

## 2020-02-06 ENCOUNTER — Other Ambulatory Visit (HOSPITAL_COMMUNITY)
Admission: RE | Admit: 2020-02-06 | Discharge: 2020-02-06 | Disposition: A | Discharge status: Home / Self Care | Payer: 59 | Source: Ambulatory Visit | Attending: Internal Medicine | Admitting: Internal Medicine

## 2020-02-06 DIAGNOSIS — Z20822 Contact with and (suspected) exposure to covid-19: Secondary | ICD-10-CM | POA: Insufficient documentation

## 2020-02-06 DIAGNOSIS — Z01812 Encounter for preprocedural laboratory examination: Secondary | ICD-10-CM | POA: Diagnosis present

## 2020-02-06 LAB — PREGNANCY, URINE: Preg Test, Ur: NEGATIVE

## 2020-02-06 LAB — SARS CORONAVIRUS 2 (TAT 6-24 HRS): SARS Coronavirus 2: NEGATIVE

## 2020-02-09 ENCOUNTER — Encounter (HOSPITAL_COMMUNITY)
Admission: RE | Disposition: A | Discharge status: Home / Self Care | Payer: Self-pay | Source: Home / Self Care | Attending: Internal Medicine

## 2020-02-09 ENCOUNTER — Ambulatory Visit (HOSPITAL_COMMUNITY)
Admission: RE | Admit: 2020-02-09 | Discharge: 2020-02-09 | Disposition: A | Discharge status: Home / Self Care | Payer: 59 | Attending: Internal Medicine | Admitting: Internal Medicine

## 2020-02-09 ENCOUNTER — Other Ambulatory Visit: Payer: Self-pay

## 2020-02-09 ENCOUNTER — Ambulatory Visit (HOSPITAL_COMMUNITY): Payer: 59 | Admitting: Anesthesiology

## 2020-02-09 ENCOUNTER — Encounter (HOSPITAL_COMMUNITY): Payer: Self-pay

## 2020-02-09 DIAGNOSIS — K2970 Gastritis, unspecified, without bleeding: Secondary | ICD-10-CM | POA: Diagnosis not present

## 2020-02-09 DIAGNOSIS — E785 Hyperlipidemia, unspecified: Secondary | ICD-10-CM | POA: Insufficient documentation

## 2020-02-09 DIAGNOSIS — D509 Iron deficiency anemia, unspecified: Secondary | ICD-10-CM | POA: Insufficient documentation

## 2020-02-09 DIAGNOSIS — Z9049 Acquired absence of other specified parts of digestive tract: Secondary | ICD-10-CM | POA: Insufficient documentation

## 2020-02-09 DIAGNOSIS — K648 Other hemorrhoids: Secondary | ICD-10-CM | POA: Insufficient documentation

## 2020-02-09 DIAGNOSIS — M1990 Unspecified osteoarthritis, unspecified site: Secondary | ICD-10-CM | POA: Diagnosis not present

## 2020-02-09 DIAGNOSIS — K449 Diaphragmatic hernia without obstruction or gangrene: Secondary | ICD-10-CM | POA: Diagnosis not present

## 2020-02-09 DIAGNOSIS — R12 Heartburn: Secondary | ICD-10-CM | POA: Insufficient documentation

## 2020-02-09 DIAGNOSIS — Z791 Long term (current) use of non-steroidal anti-inflammatories (NSAID): Secondary | ICD-10-CM | POA: Insufficient documentation

## 2020-02-09 DIAGNOSIS — Z79899 Other long term (current) drug therapy: Secondary | ICD-10-CM | POA: Insufficient documentation

## 2020-02-09 HISTORY — PX: ESOPHAGOGASTRODUODENOSCOPY (EGD) WITH PROPOFOL: SHX5813

## 2020-02-09 HISTORY — PX: COLONOSCOPY WITH PROPOFOL: SHX5780

## 2020-02-09 HISTORY — PX: BIOPSY: SHX5522

## 2020-02-09 SURGERY — COLONOSCOPY WITH PROPOFOL
Anesthesia: General

## 2020-02-09 MED ORDER — STERILE WATER FOR IRRIGATION IR SOLN
Status: DC | PRN
  Administered 2020-02-09: 1.5 mL

## 2020-02-09 MED ORDER — PROPOFOL 10 MG/ML IV BOLUS
INTRAVENOUS | Status: DC | PRN
  Administered 2020-02-09: 100 mg via INTRAVENOUS
  Administered 2020-02-09: 175 ug/kg/min via INTRAVENOUS

## 2020-02-09 MED ORDER — LACTATED RINGERS IV SOLN: Freq: Once | INTRAVENOUS | Status: AC

## 2020-02-09 MED ORDER — LIDOCAINE VISCOUS HCL 2 % MT SOLN: 15.0000 mL | Freq: Once | OROMUCOSAL | Status: AC

## 2020-02-09 MED ORDER — CHLORHEXIDINE GLUCONATE CLOTH 2 % EX PADS: 6.0000 | MEDICATED_PAD | Freq: Once | CUTANEOUS | Status: DC

## 2020-02-09 MED ORDER — LIDOCAINE HCL (CARDIAC) PF 100 MG/5ML IV SOSY
PREFILLED_SYRINGE | INTRAVENOUS | Status: DC | PRN
  Administered 2020-02-09: 80 mg via INTRAVENOUS

## 2020-02-09 MED ORDER — LACTATED RINGERS IV SOLN: INTRAVENOUS | Status: DC | PRN

## 2020-02-09 MED ORDER — GLYCOPYRROLATE 0.2 MG/ML IJ SOLN
INTRAMUSCULAR | Status: AC
  Administered 2020-02-09: 0.2 mg via INTRAVENOUS
  Filled 2020-02-09: qty 1

## 2020-02-09 MED ORDER — GLYCOPYRROLATE 0.2 MG/ML IJ SOLN: 0.2000 mg | Freq: Once | INTRAMUSCULAR | Status: AC

## 2020-02-09 MED ORDER — LIDOCAINE VISCOUS HCL 2 % MT SOLN
OROMUCOSAL | Status: AC
  Administered 2020-02-09: 15 mL via OROMUCOSAL
  Filled 2020-02-09: qty 15

## 2020-02-09 NOTE — Anesthesia Postprocedure Evaluation (Signed)
Anesthesia Post Note  Patient: Raven Smith  Procedure(s) Performed: COLONOSCOPY WITH PROPOFOL (N/A ) ESOPHAGOGASTRODUODENOSCOPY (EGD) WITH PROPOFOL (N/A ) BIOPSY  Patient location during evaluation: Endoscopy Anesthesia Type: General Level of consciousness: awake, oriented, awake and alert and patient cooperative Pain management: pain level controlled Vital Signs Assessment: post-procedure vital signs reviewed and stable Respiratory status: spontaneous breathing, respiratory function stable and nonlabored ventilation Cardiovascular status: blood pressure returned to baseline and stable Postop Assessment: no headache and no backache Anesthetic complications: no   No complications documented.   Last Vitals:  Vitals:   02/09/20 0729  BP: 114/75  Pulse: 74  Resp: 13  Temp: 36.6 C  SpO2: 100%    Last Pain:  Vitals:   02/09/20 0809  TempSrc:   PainSc: 0-No pain                 Brynda Peon

## 2020-02-09 NOTE — Interval H&P Note (Signed)
History and Physical Interval Note:  02/09/2020 7:45 AM  Raven Smith  has presented today for surgery, with the diagnosis of anemia, GERD.  The various methods of treatment have been discussed with the patient and family. After consideration of risks, benefits and other options for treatment, the patient has consented to  Procedure(s) with comments: COLONOSCOPY WITH PROPOFOL (N/A) - 8:00am ESOPHAGOGASTRODUODENOSCOPY (EGD) WITH PROPOFOL (N/A) as a surgical intervention.  The patient's history has been reviewed, patient examined, no change in status, stable for surgery.  I have reviewed the patient's chart and labs.  Questions were answered to the patient's satisfaction.     Lanelle Bal

## 2020-02-09 NOTE — Transfer of Care (Signed)
Immediate Anesthesia Transfer of Care Note  Patient: Raven Smith  Procedure(s) Performed: COLONOSCOPY WITH PROPOFOL (N/A ) ESOPHAGOGASTRODUODENOSCOPY (EGD) WITH PROPOFOL (N/A ) BIOPSY  Patient Location: Endoscopy Unit  Anesthesia Type:General  Level of Consciousness: awake, alert , oriented and patient cooperative  Airway & Oxygen Therapy: Patient Spontanous Breathing  Post-op Assessment: Report given to RN, Post -op Vital signs reviewed and stable and Patient moving all extremities  Post vital signs: Reviewed and stable  Last Vitals:  Vitals Value Taken Time  BP    Temp    Pulse    Resp    SpO2      Last Pain:  Vitals:   02/09/20 0809  TempSrc:   PainSc: 0-No pain      Patients Stated Pain Goal: 9 (02/09/20 0710)  Complications: No complications documented.

## 2020-02-09 NOTE — Discharge Instructions (Addendum)
EGD Discharge instructions Please read the instructions outlined below and refer to this sheet in the next few weeks. These discharge instructions provide you with general information on caring for yourself after you leave the hospital. Your doctor may also give you specific instructions. While your treatment has been planned according to the most current medical practices available, unavoidable complications occasionally occur. If you have any problems or questions after discharge, please call your doctor. ACTIVITY  You may resume your regular activity but move at a slower pace for the next 24 hours.   Take frequent rest periods for the next 24 hours.   Walking will help expel (get rid of) the air and reduce the bloated feeling in your abdomen.   No driving for 24 hours (because of the anesthesia (medicine) used during the test).   You may shower.   Do not sign any important legal documents or operate any machinery for 24 hours (because of the anesthesia used during the test).  NUTRITION  Drink plenty of fluids.   You may resume your normal diet.   Begin with a light meal and progress to your normal diet.   Avoid alcoholic beverages for 24 hours or as instructed by your caregiver.  MEDICATIONS  You may resume your normal medications unless your caregiver tells you otherwise.  WHAT YOU CAN EXPECT TODAY  You may experience abdominal discomfort such as a feeling of fullness or "gas" pains.  FOLLOW-UP  Your doctor will discuss the results of your test with you.  SEEK IMMEDIATE MEDICAL ATTENTION IF ANY OF THE FOLLOWING OCCUR:  Excessive nausea (feeling sick to your stomach) and/or vomiting.   Severe abdominal pain and distention (swelling).   Trouble swallowing.   Temperature over 101 F (37.8 C).   Rectal bleeding or vomiting of blood.      Colonoscopy Discharge Instructions  Read the instructions outlined below and refer to this sheet in the next few weeks. These  discharge instructions provide you with general information on caring for yourself after you leave the hospital. Your doctor may also give you specific instructions. While your treatment has been planned according to the most current medical practices available, unavoidable complications occasionally occur.   ACTIVITY  You may resume your regular activity, but move at a slower pace for the next 24 hours.   Take frequent rest periods for the next 24 hours.   Walking will help get rid of the air and reduce the bloated feeling in your belly (abdomen).   No driving for 24 hours (because of the medicine (anesthesia) used during the test).    Do not sign any important legal documents or operate any machinery for 24 hours (because of the anesthesia used during the test).  NUTRITION  Drink plenty of fluids.   You may resume your normal diet as instructed by your doctor.   Begin with a light meal and progress to your normal diet. Heavy or fried foods are harder to digest and may make you feel sick to your stomach (nauseated).   Avoid alcoholic beverages for 24 hours or as instructed.  MEDICATIONS  You may resume your normal medications unless your doctor tells you otherwise.  WHAT YOU CAN EXPECT TODAY  Some feelings of bloating in the abdomen.   Passage of more gas than usual.   Spotting of blood in your stool or on the toilet paper.  IF YOU HAD POLYPS REMOVED DURING THE COLONOSCOPY:  No aspirin products for 7 days or as  instructed.   No alcohol for 7 days or as instructed.   Eat a soft diet for the next 24 hours.  FINDING OUT THE RESULTS OF YOUR TEST Not all test results are available during your visit. If your test results are not back during the visit, make an appointment with your caregiver to find out the results. Do not assume everything is normal if you have not heard from your caregiver or the medical facility. It is important for you to follow up on all of your test results.    SEEK IMMEDIATE MEDICAL ATTENTION IF:  You have more than a spotting of blood in your stool.   Your belly is swollen (abdominal distention).   You are nauseated or vomiting.   You have a temperature over 101.   You have abdominal pain or discomfort that is severe or gets worse throughout the day.   Your EGD showed a mild amount of inflammation in your stomach.  I biopsied this to rule out infection with H. pylori.  I also biopsied your small bowel to rule out celiac disease which can lead to chronic anemia.  Await pathology results, my office will contact you.  Continue on PPI therapy.    Your colonoscopy was unremarkable.  I did not find any polyps.  We will consider capsule endoscopy for completeness.  Follow-up with GI as previously scheduled.  I hope you have a great rest of your week!  Hennie Duos. Marletta Lor, D.O. Gastroenterology and Hepatology [facility]

## 2020-02-09 NOTE — Op Note (Signed)
[facility] Patient Name: Raven Smith Procedure Date: 02/09/2020 8:03 AM MRN: [id number] Date of Birth: 1971/03/16 Attending MD: Elon Alas. Edgar Frisk CSN: [id number] Age: 49 Admit Type: Inpatient Procedure:                Upper GI endoscopy Indications:              Iron deficiency anemia, Heartburn, Suspected                            gastro-esophageal reflux disease Providers:                Elon Alas. Abbey Chatters, DO, Tammy Vaught, RN, [employer], Casimer Bilis, Technician Referring MD:              Medicines:                See the Anesthesia note for documentation of the                            administered medications Complications:            No immediate complications. Estimated Blood Loss:     Estimated blood loss was minimal. Procedure:                Pre-Anesthesia Assessment:                           - The anesthesia plan was to use monitored                            anesthesia care (MAC).                           After obtaining informed consent, the endoscope was                            passed under direct vision. Throughout the                            procedure, the patient's blood pressure, pulse, and                            oxygen saturations were monitored continuously. The                            Endoscope was introduced through the mouth, and                            advanced to the second part of duodenum. The upper                            GI endoscopy was accomplished without difficulty.                            The patient tolerated the  procedure well. Scope In: 8:12:58 AM Scope Out: 8:16:39 AM Total Procedure Duration: 0 hours 3 minutes 41 seconds  Findings:      A small hiatal hernia was present.      There is no endoscopic evidence of bleeding, areas of erosion,       esophagitis, inflammation, ulcerations or varices in the entire       esophagus.      Localized mild inflammation  characterized by erythema was found in the       gastric antrum. Biopsies were taken with a cold forceps for Helicobacter       pylori testing.      The duodenal bulb, first portion of the duodenum and second portion of       the duodenum were normal. Biopsies for histology were taken with a cold       forceps for evaluation of celiac disease. Impression:               - Small hiatal hernia.                           - Gastritis. Biopsied.                           - Normal duodenal bulb, first portion of the                            duodenum and second portion of the duodenum.                            Biopsied. Moderate Sedation:      Per Anesthesia Care Recommendation:           - Patient has a contact number available for                            emergencies. The signs and symptoms of potential                            delayed complications were discussed with the                            patient. Return to normal activities tomorrow.                            Written discharge instructions were provided to the                            patient.                           - Resume previous diet.                           - Continue present medications.                           - Await pathology results.                           -  Return to GI clinic as previously scheduled. Procedure Code(s):        --- Professional ---                           [phone], Esophagogastroduodenoscopy, flexible,                            transoral; with biopsy, single or multiple Diagnosis Code(s):        --- Professional ---                           K44.9, Diaphragmatic hernia without obstruction or                            gangrene                           K29.70, Gastritis, unspecified, without bleeding                           D50.9, Iron deficiency anemia, unspecified                           R12, Heartburn CPT copyright 2019 American Medical Association. All rights  reserved. The codes documented in this report are preliminary and upon coder review may  be revised to meet current compliance requirements. Elon Alas. Abbey Chatters, DO [name], DO 02/09/2020 8:19:00 AM This report has been signed electronically. Number of Addenda: 0

## 2020-02-09 NOTE — Op Note (Signed)
[facility] Patient Name: Raven Smith Procedure Date: 02/09/2020 8:18 AM MRN: [id number] Date of Birth: 08-27-1970 Attending MD: Elon Alas. Abbey Chatters DO CSN: [id number] Age: 49 Admit Type: Outpatient Procedure:                Colonoscopy Indications:              Iron deficiency anemia Providers:                Elon Alas. Abbey Chatters, DO, Jessica Boudreaux, Casimer Bilis, Technician Referring MD:              Medicines:                See the Anesthesia note for documentation of the                            administered medications Complications:            No immediate complications. Estimated Blood Loss:     Estimated blood loss: none. Estimated blood loss:                            none. Procedure:                Pre-Anesthesia Assessment:                           - The anesthesia plan was to use monitored                            anesthesia care (MAC).                           After obtaining informed consent, the colonoscope                            was passed under direct vision. Throughout the                            procedure, the patient's blood pressure, pulse, and                            oxygen saturations were monitored continuously. The                            PCF-[id number] ([id number]) was introduced through the                            anus and advanced to the the terminal ileum, with                            identification of the appendiceal orifice and IC                            valve. The colonoscopy was performed without  difficulty. The patient tolerated the procedure                            well. The quality of the bowel preparation was                            evaluated using the BBPS [facility] Bowel Preparation                            Scale) with scores of: Right Colon = 3, Transverse                            Colon = 3 and Left Colon = 3 (entire mucosa seen                             well with no residual staining, small fragments of                            stool or opaque liquid). The total BBPS score                            equals 9. Scope In: 8:20:24 AM Scope Out: 8:28:52 AM Scope Withdrawal Time: 0 hours 6 minutes 26 seconds  Total Procedure Duration: 0 hours 8 minutes 28 seconds  Findings:      The perianal and digital rectal examinations were normal.      Non-bleeding internal hemorrhoids were found during endoscopy.      The terminal ileum appeared normal.      The exam was otherwise without abnormality. Impression:               - Non-bleeding internal hemorrhoids.                           - The examined portion of the ileum was normal.                           - The examination was otherwise normal.                           - No specimens collected. Moderate Sedation:      Per Anesthesia Care Recommendation:           - Patient has a contact number available for                            emergencies. The signs and symptoms of potential                            delayed complications were discussed with the                            patient. Return to normal activities tomorrow.                            Written discharge instructions were  provided to the                            patient.                           - Resume previous diet.                           - Continue present medications.                           - Repeat colonoscopy in 10 years for screening                            purposes.                           - Return to GI clinic PRN. Procedure Code(s):        --- Professional ---                           [phone], Colonoscopy, flexible; diagnostic, including                            collection of specimen(s) by brushing or washing,                            when performed (separate procedure) Diagnosis Code(s):        --- Professional ---                           K64.8, Other hemorrhoids                            D50.9, Iron deficiency anemia, unspecified CPT copyright 2019 American Medical Association. All rights reserved. The codes documented in this report are preliminary and upon coder review may  be revised to meet current compliance requirements. Elon Alas. Abbey Chatters, DO Fowler Abbey Chatters, DO 02/09/2020 8:33:15 AM This report has been signed electronically. Number of Addenda: 0

## 2020-02-09 NOTE — Anesthesia Preprocedure Evaluation (Addendum)
Anesthesia Evaluation  Patient identified by MRN, date of birth, ID band Patient awake    Reviewed: Allergy & Precautions, NPO status , Patient's Chart, lab work & pertinent test results  History of Anesthesia Complications Negative for: history of anesthetic complications  Airway Mallampati: III  TM Distance: >3 FB Neck ROM: Full    Dental  (+) Dental Advisory Given, Teeth Intact   Pulmonary shortness of breath,    Pulmonary exam normal breath sounds clear to auscultation       Cardiovascular negative cardio ROS Normal cardiovascular exam Rhythm:Regular Rate:Normal     Neuro/Psych    GI/Hepatic Neg liver ROS, GERD  Medicated,  Endo/Other  negative endocrine ROS  Renal/GU negative Renal ROS     Musculoskeletal  (+) Arthritis , chronic back pain     Abdominal   Peds  Hematology  (+) anemia ,   Anesthesia Other Findings   Reproductive/Obstetrics negative OB ROS                            Anesthesia Physical Anesthesia Plan  ASA: II  Anesthesia Plan: General   Post-op Pain Management:    Induction: Intravenous  PONV Risk Score and Plan: TIVA  Airway Management Planned: Nasal Cannula and Natural Airway  Additional Equipment:   Intra-op Plan:   Post-operative Plan:   Informed Consent: I have reviewed the patients History and Physical, chart, labs and discussed the procedure including the risks, benefits and alternatives for the proposed anesthesia with the patient or authorized representative who has indicated his/her understanding and acceptance.     Dental advisory given  Plan Discussed with: Surgeon and CRNA  Anesthesia Plan Comments:         Anesthesia Quick Evaluation

## 2020-02-10 ENCOUNTER — Other Ambulatory Visit: Payer: Self-pay

## 2020-02-10 LAB — SURGICAL PATHOLOGY

## 2020-02-12 ENCOUNTER — Encounter (HOSPITAL_COMMUNITY): Payer: Self-pay | Admitting: Internal Medicine

## 2020-04-12 DIAGNOSIS — E559 Vitamin D deficiency, unspecified: Secondary | ICD-10-CM | POA: Diagnosis not present

## 2020-04-12 DIAGNOSIS — E0590 Thyrotoxicosis, unspecified without thyrotoxic crisis or storm: Secondary | ICD-10-CM | POA: Diagnosis not present

## 2020-04-12 DIAGNOSIS — R946 Abnormal results of thyroid function studies: Secondary | ICD-10-CM | POA: Diagnosis not present

## 2020-04-19 NOTE — Progress Notes (Signed)
Referring Provider: [facility] May[facility]ll[facility], [facility] Primary Care Physician:  Nathen MayPllc, [facility] Primary GI:  Dr. Marletta Lorarver  Chief Complaint  Patient presents with  . Gastroesophageal Reflux  . Anemia    HPI:   Raven Smith is a 49 y.o. female who presents for follow-up on GERD and anemia.  The patient was last seen in our office 01/15/2020 for GERD, IDA, constipation.  Her last visit was referral from the emergency department when she was seen in August of this year for symptomatic anemia including weakness, near syncope, hemoglobin 6.9.  Notes heavy periods though "much heavier in the past" and is seeing her gynecologist and was recommended a trial of Orilissa to regulate her menses but has not started as of yet.  On iron supplements in the past but not recently.  Denies any GERD, hematochezia, melena in the ED.  She received 2 units PRBCs and was started on oral iron.  Recommended consider endoscopic evaluation to rule out GI source.  At her last visit she again admitted heavy menses but some slowing recently and has not started [place] yet.  Notes mid abdominal pain intermittently about once every other week that lasts 30 to 60 minutes and possibly triggered by constipation.  Notes on and off constipation.  Also admits "terrible" GERD symptoms with esophageal burning and water brash that occurs daily.  She does consume typical triggers.  Omeprazole twice daily helps a little bit but not much, also using Tums.  Occasional Naprosyn, regular Mobic though it does not always take this as directed.  No other overt GI complaints.  She did admit improvement in dyspnea since transfusion and no recurrent near syncope.  She admits that she has neglected her health since she lost her son in the car crash about 1 year ago with associated significant grieving.  Recommend repeat CBC and iron panel, EGD and colonoscopy with possible H. pylori biopsy, stop omeprazole and start Protonix 40 mg twice daily,  printed GERD information, trigger avoidance, follow-up in 3 months.  Her labs are completed 01/15/2020 including CBC is showed significant improvement in hemoglobin to 10.3, still microcytic and hyperchromic.  Iron low at 25, ferritin low at 15.  Overall labs much improved compared to the ED.  EGD completed 02/09/2020 which found small hiatal hernia, gastritis status post biopsy, normal duodenum status post biopsy.  Surgical pathology found the gastric biopsies to be benign gastric mucosa without H. pylori and the duodenal biopsies to be benign duodenal mucosa.  Recommended continue current medications and follow-up in the GI clinic as needed.  Colonoscopy the same day found nonbleeding internal hemorrhoids, normal ileum, otherwise normal.  Recommended repeat colonoscopy in 10 years.  Today she states she is doing okay overall. She is still having GERD symptoms. She had a change of insurance and was unable to go to the pharmacy to get Protonix filled. Asking for it to be resent. Has GERD symptoms pretty much every day. States sometimes her rectal area feels "a little funny" and we discussed internal hemorrhoids and any treatment options for this. She is generally asymptomatic with them at this point. Has intermittent abdominal pain in the lower abdomen described as crampy, typically improves with a bowel movement. Has ongoing constipation with bowel movement about every other day with "a little" straining. Hasn't taken anything for constipation. Denies abdominal pain, N/V, hematochezia, melena, fever, chills.  Past Medical History:  Diagnosis Date  . Anemia   . Arthritis   . Chronic  back pain   . 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 Hyperlipidemia 2014   rx'd zocor; didn't do  . Irregular menstrual bleeding 04/23/2013    Past Surgical History:  Procedure Laterality Date  . BIOPSY  02/09/2020   Procedure: BIOPSY;  Surgeon: Lanelle Bal, DO;  Location: [facility];  Service: Endoscopy;;  .  CHOLECYSTECTOMY    . COLONOSCOPY WITH PROPOFOL N/A 02/09/2020   Procedure: COLONOSCOPY WITH PROPOFOL;  Surgeon: Lanelle Bal, DO;  Location: [facility];  Service: Endoscopy;  Laterality: N/A;  8:00am  . ESOPHAGOGASTRODUODENOSCOPY (EGD) WITH PROPOFOL N/A 02/09/2020   Procedure: ESOPHAGOGASTRODUODENOSCOPY (EGD) WITH PROPOFOL;  Surgeon: Lanelle Bal, DO;  Location: [facility];  Service: Endoscopy;  Laterality: N/A;  . TUBAL LIGATION      Current Outpatient Medications  Medication Sig Dispense Refill  . loratadine (CLARITIN) 10 MG tablet Take 10 mg by mouth daily as needed for allergies.    . Vitamin D, Ergocalciferol, (DRISDOL) 1.25 MG (50000 UNIT) CAPS capsule Take 1 capsule by mouth once a week.    . pantoprazole (PROTONIX) 40 MG tablet Take 1 tablet (40 mg total) by mouth daily. 60 tablet 2   No current facility-administered medications for this visit.    Allergies as of 04/20/2020  . (No Known Allergies)    Family History  Problem Relation Age of Onset  . Fibroids Mother   . Diabetes Mother   . Diabetes Father   . Diabetes Maternal Grandmother   . Heart failure Maternal Grandmother   . Other Son        MVA  . Colon cancer Neg Hx   . Gastric cancer Neg Hx     Social History   Socioeconomic History  . Marital status: Single    Spouse name: Not on file  . Number of children: Not on file  . Years of education: Not on file  . Highest education level: Not on file  Occupational History  . Not on file  Tobacco Use  . Smoking status: Never Smoker  . Smokeless tobacco: Never Used  Vaping Use  . Vaping Use: Never used  Substance and Sexual Activity  . Alcohol use: Yes    Comment: occasional  . Drug use: No  . Sexual activity: Yes    Birth control/protection: Surgical    Comment: tubal  Other Topics Concern  . Not on file  Social History Narrative  . Not on file   Social Determinants of Health   Financial Resource Strain:   . Difficulty of Paying  Living Expenses: Not on file  Food Insecurity:   . Worried About Programme researcher, broadcasting/film/video in the Last Year: Not on file  . Ran Out of Food in the Last Year: Not on file  Transportation Needs:   . Lack of Transportation (Medical): Not on file  . Lack of Transportation (Non-Medical): Not on file  Physical Activity:   . Days of Exercise per Week: Not on file  . Minutes of Exercise per Session: Not on file  Stress:   . Feeling of Stress : Not on file  Social Connections:   . Frequency of Communication with Friends and Family: Not on file  . Frequency of Social Gatherings with Friends and Family: Not on file  . Attends Religious Services: Not on file  . Active Member of Clubs or Organizations: Not on file  . Attends Banker Meetings: Not on file  . Marital Status: Not on  file    Subjective: Review of Systems  Constitutional: Negative for chills, fever, malaise/fatigue and weight loss.  HENT: Negative for congestion and sore throat.   Respiratory: Negative for cough and shortness of breath.   Cardiovascular: Negative for chest pain and palpitations.  Gastrointestinal: Positive for abdominal pain, constipation and heartburn. Negative for blood in stool, diarrhea, melena, nausea and vomiting.  Musculoskeletal: Negative for joint pain and myalgias.  Skin: Negative for rash.  Neurological: Negative for dizziness and weakness.  Endo/Heme/Allergies: Does not bruise/bleed easily.  Psychiatric/Behavioral: Negative for depression. The patient is not nervous/anxious.   All other systems reviewed and are negative.    Objective: BP 122/70   Pulse 90   Temp (!) 97.1 F (36.2 C)   Ht 5\' 9"  (1.753 m)   Wt 156 lb 6.4 oz (70.9 kg)   BMI 23.10 kg/m  Physical Exam Vitals and nursing note reviewed.  Constitutional:      General: She is not in acute distress.    Appearance: Normal appearance. She is well-developed and normal weight. She is not ill-appearing, toxic-appearing or  diaphoretic.  HENT:     Head: Normocephalic and atraumatic.     Nose: No congestion or rhinorrhea.  Eyes:     General: No scleral icterus. Cardiovascular:     Rate and Rhythm: Normal rate and regular rhythm.     Heart sounds: Normal heart sounds.  Pulmonary:     Effort: Pulmonary effort is normal. No respiratory distress.     Breath sounds: Normal breath sounds.  Abdominal:     General: Bowel sounds are normal.     Palpations: Abdomen is soft. There is no hepatomegaly, splenomegaly or mass.     Tenderness: There is no abdominal tenderness. There is no guarding or rebound.     Hernia: No hernia is present.  Skin:    General: Skin is warm and dry.     Coloration: Skin is not jaundiced.     Findings: No rash.  Neurological:     General: No focal deficit present.     Mental Status: She is alert and oriented to person, place, and time.  Psychiatric:        Attention and Perception: Attention normal.        Mood and Affect: Mood normal.        Speech: Speech normal.        Behavior: Behavior normal.        Thought Content: Thought content normal.        Cognition and Memory: Cognition and memory normal.      Assessment:  Very pleasant 49 year old female who presents for follow-up on anemia and GERD.  Her GERD symptoms are ongoing.  She seems to be having some worsening constipation and we had a good lengthy discussion about internal hemorrhoids and possible treatment options, although she remains asymptomatic.  No red flag/warning signs or symptoms.    GERD: She is not doing any better with her GERD, although she admits she has changed insurance and has not picked up her Protonix. She does have a new insurance in place and is requesting a refill to be sent to her pharmacy for the new medication (Protonix 40 mg twice daily).  Constipation and associated lower abdominal cramping: The patient does admit she is somewhat constipated, with bowel movement every other day and some  straining.  She has noted some lower abdominal cramping since she has become more constipated.  She also has had noted  some bloating as well.  I do not think she is severely constipated and we can likely treat with over-the-counter options.  I recommended Colace 100 mg with addition of plenty of water.  I have asked her to let us know if she has any worsening symptoms.  I feel her abdominal pain will improve significantly if we get her constipation under better control  Anemia: Endoscopic evaluation with colonoscopy and upper endoscopy without obvious source of bleeding.  She does have internal nonbleeding hemorrhoids and mild gastritis with benign pathology.  She does have a history of heavy menses as previously discussed.  I feel her anemia is most likely gynecological rather than gastroenterological.  I will recheck a CBC to make sure that she is holding steady.  I recommended she follow-up with gynecology (she states she needs to schedule an appointment).  If her anemia continues to be persistent she may need a hematology evaluation.   Plan: 1. Protonix 40 mg twice daily 2. Colace 100 mg daily 3. Drink 48 to 64 ounces of water a day 4. CBC 5. Follow-up with gynecology 6. Consider hematology evaluation in the future pending progression of her anemia 7. Follow-up in 3 months    Thank you for allowing [place] to participate in the care of Raven A Jeannene Patella, DNP, AGNP-C Adult & Gerontological Nurse Practitioner [facility]   04/20/2020 9:56 AM   Disclaimer: This note was dictated with voice recognition software. Similar sounding words can inadvertently be transcribed and may not be corrected upon review.

## 2020-04-20 ENCOUNTER — Ambulatory Visit: Payer: 59 | Admitting: Nurse Practitioner

## 2020-04-20 ENCOUNTER — Encounter: Payer: Self-pay | Admitting: Nurse Practitioner

## 2020-04-20 ENCOUNTER — Other Ambulatory Visit: Payer: Self-pay

## 2020-04-20 VITALS — BP 122/70 | HR 90 | Temp 97.1°F | Ht 69.0 in | Wt 156.4 lb

## 2020-04-20 DIAGNOSIS — K219 Gastro-esophageal reflux disease without esophagitis: Secondary | ICD-10-CM

## 2020-04-20 DIAGNOSIS — K5900 Constipation, unspecified: Secondary | ICD-10-CM | POA: Diagnosis not present

## 2020-04-20 DIAGNOSIS — L659 Nonscarring hair loss, unspecified: Secondary | ICD-10-CM | POA: Diagnosis not present

## 2020-04-20 DIAGNOSIS — R946 Abnormal results of thyroid function studies: Secondary | ICD-10-CM | POA: Diagnosis not present

## 2020-04-20 DIAGNOSIS — R1030 Lower abdominal pain, unspecified: Secondary | ICD-10-CM | POA: Diagnosis not present

## 2020-04-20 DIAGNOSIS — R109 Unspecified abdominal pain: Secondary | ICD-10-CM | POA: Insufficient documentation

## 2020-04-20 DIAGNOSIS — D500 Iron deficiency anemia secondary to blood loss (chronic): Secondary | ICD-10-CM

## 2020-04-20 DIAGNOSIS — E010 Iodine-deficiency related diffuse (endemic) goiter: Secondary | ICD-10-CM | POA: Diagnosis not present

## 2020-04-20 DIAGNOSIS — E559 Vitamin D deficiency, unspecified: Secondary | ICD-10-CM | POA: Diagnosis not present

## 2020-04-20 MED ORDER — PANTOPRAZOLE SODIUM 40 MG PO TBEC: 40.0000 mg | DELAYED_RELEASE_TABLET | Freq: Every day | ORAL | 2 refills | Status: DC

## 2020-04-20 NOTE — Progress Notes (Signed)
Cc'ed to pcp °

## 2020-04-20 NOTE — Patient Instructions (Signed)
Your health issues we discussed today were:   Abdominal pain with constipation: 1. As we discussed, constipation can cause abdominal discomfort and bloating 2. Start taking Colace 100 mg once a day. 3. Make sure you are drinking plenty of water (aim for 48 to 64 ounces a day) 4. You can slowly increase your water until you reach your goal 5. Call us for any worsening or severe symptoms  GERD (reflux/heartburn): 1. I am glad you have a new insurance in place 2. I have sent a prescription for Protonix 40 mg to be taken twice daily to your pharmacy 3. Take this pursing in the morning on empty stomach and 30 minutes for your last meal the day 4. Call us for any worsening or severe symptoms  Anemia (blood loss): 1. As discussed, your upper endoscopy and colonoscopy did not show any obvious sources or causes of bleeding 2. Given your history of heavy menstrual cycles I feel your anemia is likely at least in part due to gynecology 3. Please follow-up with gynecology as soon as you can significant further discuss treatment options 4. Have your blood cell count checked when you are able to 5. If your anemia is not able to be gotten under control we may need to eventually refer you to a hematologist (blood specialist) to manage her anemia 6. Call us for any worsening or severe symptoms such as chest pain, shortness of breath, dizziness, weakness, passing out, nearly passing out 7. Let us know if you see any obvious blood in your stool such as bright red blood in your stool or pitch black/tarry stools  Overall I recommend:  1. Continue your other current medications 2. Return for follow-up in 3 months 3. Call us for any questions or concerns   ---------------------------------------------------------------  I am glad you have gotten your COVID-19 vaccination!  Even though you are fully vaccinated you should continue to follow CDC and state/local guidelines.   ---------------------------------------------------------------   At [facility] we value your feedback. You may receive a survey about your visit today. Please share your experience as we strive to create trusting relationships with our patients to provide genuine, compassionate, quality care.  We appreciate your understanding and patience as we review any laboratory studies, imaging, and other diagnostic tests that are ordered as we care for you. Our office policy is 5 business days for review of these results, and any emergent or urgent results are addressed in a timely manner for your best interest. If you do not hear from our office in 1 week, please contact us.   We also encourage the use of MyChart, which contains your medical information for your review as well. If you are not enrolled in this feature, an access code is on this after visit summary for your convenience. Thank you for allowing [place] to be involved in your care.  It was great to see you today!  I hope you have a Merry Christmas and [address]!!

## 2020-05-24 ENCOUNTER — Telehealth: Payer: Self-pay | Admitting: Internal Medicine

## 2020-05-24 NOTE — Telephone Encounter (Signed)
Phoned and spoke with the pt and was advised by her she has insurance now and would like her Protonix filled. I got in contact with [facility] and advised them she had insurance and to fill her Rx for her.

## 2020-05-24 NOTE — Telephone Encounter (Signed)
Patient called and said that she was supposed to have something called in for reflux and at the time she had no insurance.  She has insurance now and would like it sent to [facility] on [address]   [phone] patient number)

## 2020-07-19 DIAGNOSIS — D649 Anemia, unspecified: Secondary | ICD-10-CM | POA: Diagnosis not present

## 2020-07-20 ENCOUNTER — Ambulatory Visit: Payer: BC Managed Care – PPO | Admitting: Nurse Practitioner

## 2020-07-20 ENCOUNTER — Encounter: Payer: Self-pay | Admitting: Internal Medicine

## 2020-07-20 NOTE — Progress Notes (Deleted)
Referring Provider: [facility] Primary Care Physician:  [facility] Primary GI:  Dr. Marletta Lor  No chief complaint on file.   HPI:   Raven Smith is a 50 y.o. female who presents for follow-up on constipation and abdominal pain.  The patient was last seen in our office 04/20/2020 for GERD, IDA, constipation, lower abdominal pain.  Previous ER visit with hemoglobin of 6.9 recently with noted heavy periods although not as bad as previously.  Seen by gynecology and recommended a trial of Orilissa but has not started yet.  Denied GERD, hematochezia, melena.  In the ER she received PRBCs x2 and started on oral iron and recommended endoscopic evaluation.  Previous follow-up with insidious GERD symptoms, occasional NSAIDs, recent significant stress with the loss of her son in a car crash 1 year prior.  EGD was completed which found small hiatal hernia gastritis found to be benign gastric mucosa and recommended continue current medications and follow-up.  Colonoscopy the same day with nonbleeding internal hemorrhoids and recommended a 10-year repeat.  At her last visit still with GERD symptoms, had a change of insurance unable to get Protonix filled and it was subsequently sent to her pharmacy again.  Sometimes her rectal area feels "a little funny" likely due to internal hemorrhoids although generally asymptomatic otherwise from these.  Intermittent lower abdominal pain that typically improves with a bowel movement with noted ongoing constipation and a bowel movement every other day with little straining.  Recommended Protonix 40 mg twice daily, Colace 100 mg daily, drink adequate fluids of 48 to 64 ounces of water a day, follow-up gynecology, consider hematology pending CBC results, follow-up in 3 months.  CBC has not been completed.  She called our office indicating she has insurance now and requested Protonix sent to her pharmacy which was completed.  Today she  states she doing okay overall.  Past Medical History:  Diagnosis Date  . Anemia   . Arthritis   . Chronic back pain   . 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 Hyperlipidemia 2014   rx'd zocor; didn't do  . Irregular menstrual bleeding 04/23/2013    Past Surgical History:  Procedure Laterality Date  . BIOPSY  02/09/2020   Procedure: BIOPSY;  Surgeon: Lanelle Bal, DO;  [facility];  Service: Endoscopy;;  . CHOLECYSTECTOMY    . COLONOSCOPY WITH PROPOFOL N/A 02/09/2020   Procedure: COLONOSCOPY WITH PROPOFOL;  Surgeon: Lanelle Bal, DO;  Location: [facility];  Service: Endoscopy;  Laterality: N/A;  8:00am  . ESOPHAGOGASTRODUODENOSCOPY (EGD) WITH PROPOFOL N/A 02/09/2020   Procedure: ESOPHAGOGASTRODUODENOSCOPY (EGD) WITH PROPOFOL;  Surgeon: Lanelle Bal, DO;  [facility]: [facility];  Service: Endoscopy;  Laterality: N/A;  . TUBAL LIGATION      Current Outpatient Medications  Medication Sig Dispense Refill  . loratadine (CLARITIN) 10 MG tablet Take 10 mg by mouth daily as needed for allergies.    . pantoprazole (PROTONIX) 40 MG tablet Take 1 tablet (40 mg total) by mouth daily. 60 tablet 2  . Vitamin D, Ergocalciferol, (DRISDOL) 1.25 MG (50000 UNIT) CAPS capsule Take 1 capsule by mouth once a week.     No current facility-administered medications for this visit.    Allergies as of 07/20/2020  . (No Known Allergies)    Family History  Problem Relation Age of Onset  . Fibroids Mother   . Diabetes Mother   . Diabetes Father   . Diabetes Maternal  Grandmother   . Heart failure Maternal Grandmother   . Other Son        MVA  . Colon cancer Neg Hx   . Gastric cancer Neg Hx     Social History   Socioeconomic History  . Marital status: Single    Spouse name: Not on file  . Number of children: Not on file  . Years of education: Not on file  . Highest education level: Not on file  Occupational History  . Not on file  Tobacco Use  . Smoking status:  Never Smoker  . Smokeless tobacco: Never Used  Vaping Use  . Vaping Use: Never used  Substance and Sexual Activity  . Alcohol use: Yes    Comment: occasional  . Drug use: No  . Sexual activity: Yes    Birth control/protection: Surgical    Comment: tubal  Other Topics Concern  . Not on file  Social History Narrative  . Not on file   Social Determinants of Health   Financial Resource Strain: Not on file  Food Insecurity: Not on file  Transportation Needs: Not on file  Physical Activity: Not on file  Stress: Not on file  Social Connections: Not on file    Subjective:*** Review of Systems  Constitutional: Negative for chills, fever, malaise/fatigue and weight loss.  HENT: Negative for congestion and sore throat.   Respiratory: Negative for cough and shortness of breath.   Cardiovascular: Negative for chest pain and palpitations.  Gastrointestinal: Negative for abdominal pain, blood in stool, diarrhea, melena, nausea and vomiting.  Musculoskeletal: Negative for joint pain and myalgias.  Skin: Negative for rash.  Neurological: Negative for dizziness and weakness.  Endo/Heme/Allergies: Does not bruise/bleed easily.  Psychiatric/Behavioral: Negative for depression. The patient is not nervous/anxious.   All other systems reviewed and are negative.    Objective: There were no vitals taken for this visit. Physical Exam Vitals and nursing note reviewed.  Constitutional:      General: She is not in acute distress.    Appearance: Normal appearance. She is well-developed. She is not ill-appearing, toxic-appearing or diaphoretic.  HENT:     Head: Normocephalic and atraumatic.     Nose: No congestion or rhinorrhea.  Eyes:     General: No scleral icterus. Cardiovascular:     Rate and Rhythm: Normal rate and regular rhythm.     Heart sounds: Normal heart sounds.  Pulmonary:     Effort: Pulmonary effort is normal. No respiratory distress.     Breath sounds: Normal breath  sounds.  Abdominal:     General: Bowel sounds are normal.     Palpations: Abdomen is soft. There is no hepatomegaly, splenomegaly or mass.     Tenderness: There is no abdominal tenderness. There is no guarding or rebound.     Hernia: No hernia is present.  Skin:    General: Skin is warm and dry.     Coloration: Skin is not jaundiced.     Findings: No rash.  Neurological:     General: No focal deficit present.     Mental Status: She is alert and oriented to person, place, and time.  Psychiatric:        Attention and Perception: Attention normal.        Mood and Affect: Mood normal.        Speech: Speech normal.        Behavior: Behavior normal.        Thought Content: Thought content  normal.        Cognition and Memory: Cognition and memory normal.      Assessment:  ***   Plan: ***    Thank you for allowing [place] to participate in the care of Raven A Jeannene Patella, DNP, AGNP-C Adult & Gerontological Nurse Practitioner [facility]   07/20/2020 7:56 AM   Disclaimer: This note was dictated with voice recognition software. Similar sounding words can inadvertently be transcribed and may not be corrected upon review.

## 2020-11-01 ENCOUNTER — Other Ambulatory Visit: Payer: Self-pay

## 2020-11-01 ENCOUNTER — Ambulatory Visit
Admission: EM | Admit: 2020-11-01 | Discharge: 2020-11-01 | Disposition: A | Discharge status: Home / Self Care | Payer: BC Managed Care – PPO | Attending: Family Medicine | Admitting: Family Medicine

## 2020-11-01 DIAGNOSIS — Z1152 Encounter for screening for COVID-19: Secondary | ICD-10-CM

## 2020-11-01 DIAGNOSIS — J0140 Acute pansinusitis, unspecified: Secondary | ICD-10-CM

## 2020-11-01 MED ORDER — AMOXICILLIN 875 MG PO TABS: 875.0000 mg | ORAL_TABLET | Freq: Two times a day (BID) | ORAL | 0 refills | Status: AC

## 2020-11-01 MED ORDER — KETOROLAC TROMETHAMINE 30 MG/ML IJ SOLN
30.0000 mg | Freq: Once | INTRAMUSCULAR | Status: AC
  Administered 2020-11-01: 30 mg via INTRAMUSCULAR

## 2020-11-01 MED ORDER — PREDNISONE 20 MG PO TABS: 20.0000 mg | ORAL_TABLET | Freq: Every day | ORAL | 0 refills | Status: AC

## 2020-11-01 NOTE — ED Triage Notes (Signed)
Triaged by provider  

## 2020-11-01 NOTE — Discharge Instructions (Signed)
Your COVID 19 results should result within 3-5 days. °Negative results are immediately resulted to Mychart.  ° °Positive results will receive a follow-up call from our clinic. If symptoms are present, I recommend home quarantine until results are known.  ° °Alternate Tylenol and ibuprofen as needed for body aches and fever.  Symptom management per recommendations discussed today.  If any breathing difficulty or chest pain develops go immediately to the closest emergency department for evaluation.  °

## 2020-11-01 NOTE — ED Provider Notes (Signed)
[facility]    CSN: [id number] Arrival date & time: 11/01/20  1855      History   Chief Complaint No chief complaint on file.   HPI Raven Smith is a 50 y.o. female.   HPI Headache and sinus congestion x 4 days .Nausea present today.  No loose stools. No chills or body aches. No known exposure to COVID or flu. She has a history of anemia and reports CBC checked 1 month ago and was around hemoglobin 8.5. patient has history of prior anemia requiring transfusion. BP is soft. Endorses fatigue and chills today. Low grade fever on arrival. 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   Hyperlipidemia 2014    Past Surgical History:  Procedure Laterality Date   BIOPSY  02/09/2020   Procedure: BIOPSY;  Surgeon: Lanelle Bal, DO;  [facility];  Service: Endoscopy;;   CHOLECYSTECTOMY     COLONOSCOPY WITH PROPOFOL N/A 02/09/2020   Procedure: COLONOSCOPY WITH PROPOFOL;  Surgeon: Lanelle Bal, DO;  [facility];  Service: Endoscopy;  Laterality: N/A;  8:00am   ESOPHAGOGASTRODUODENOSCOPY (EGD) WITH PROPOFOL N/A 02/09/2020   Procedure: ESOPHAGOGASTRODUODENOSCOPY (EGD) WITH PROPOFOL;  Surgeon: Lanelle Bal, DO;  [facility];  Service: Endoscopy;  Laterality: N/A;   TUBAL LIGATION      OB History     Gravida  4   Para  2   Term      Preterm  2   AB  2   Living   1      SAB  2   IAB      Ectopic      Multiple      Live Births  2            Home Medications    Prior to Admission medications   Medication Sig Start Date End Date Taking? Authorizing Provider  loratadine (CLARITIN) 10 MG tablet Take 10 mg by mouth daily as needed for allergies.    [provider]  pantoprazole (PROTONIX) 40 MG tablet Take 1 tablet (40 mg total) by mouth daily. 04/20/20   Anice Paganini, NP  Vitamin D, Ergocalciferol, (DRISDOL) 1.25 MG (50000 UNIT) CAPS capsule Take 1 capsule by mouth once a week. 10/02/19   [provider]    Family History Family History  Problem Relation Age of Onset   Fibroids Mother    Diabetes Mother    Diabetes Father    Diabetes Maternal Grandmother    Heart failure Maternal Grandmother    Other Son        MVA   Colon cancer Neg Hx    Gastric cancer Neg Hx     Social History Social History   Tobacco Use   Smoking status: Never   Smokeless tobacco: Never  Vaping Use   Vaping Use: Never used  Substance  Use Topics   Alcohol use: Yes    Comment: occasional   Drug use: No     Allergies   Patient has no known allergies.   Review of Systems Review of Systems Pertinent negatives listed in HPI   Physical Exam Triage Vital Signs ED Triage Vitals [11/01/20 1913]  Enc Vitals Group     BP 105/64     Pulse Rate 96     Resp 17     Temp 99 F (37.2 C)     Temp Source Tympanic     SpO2 100 %     Weight      Height      Head Circumference      Peak Flow      Pain Score      Pain Loc      Pain Edu?      Excl. in GC?    No data found.  Updated Vital Signs BP 105/64 (BP Location: Right Arm)   Pulse 96   Temp 99 F (37.2 C) (Tympanic)   Resp 17   SpO2 100%   Visual Acuity Right Eye Distance:   Left Eye Distance:   Bilateral Distance:    Right Eye Near:   Left Eye Near:    Bilateral Near:     Physical Exam Constitutional: Patient appears generally well-developed and  well-nourished. No distress. HENT: Normocephalic, atraumatic, External right and left ear normal. Rhinorrhea and congestion present. Oropharynx clear. Eyes: Conjunctivae and EOM are normal. PERRLA, no scleral icterus. Neck: Normal ROM. Neck supple. No JVD. No tracheal deviation. No thyromegaly. CVS: RRR, S1/S2 +, no murmurs, no gallops, no carotid bruit.  Pulmonary: Effort and breath sounds normal, no stridor, rhonchi, wheezes, rales.  Abdominal: Soft. BS +, no distension, tenderness, rebound or guarding.  Musculoskeletal: Normal range of motion. No edema and no tenderness.  Neuro: Alert. Normal reflexes, muscle tone coordination. No cranial nerve deficit. Skin: Skin is warm and dry. No rash noted. Not diaphoretic. No erythema. No pallor. Psychiatric: Normal mood and affect. Behavior, judgment, thought content normal. UC Treatments / Results  Labs (all labs ordered are listed, but only abnormal results are displayed) Labs Reviewed - No data to display  EKG   Radiology No results found.  Procedures Procedures (including critical care time)  Medications Ordered in UC Medications - No data to display  Initial Impression / Assessment and Plan / UC Course  I have reviewed the triage vital signs and the nursing notes.  Pertinent labs & imaging results that were available during my care of the patient were reviewed by me and considered in my medical decision making (see chart for details).    Acute pansinusitis treatment with amoxicillin and prednisone for facial pressure.  Hydrate well with fluids. Given history of anemia, recommend ER if symptoms do not improve within 24 hours as facial pressure and or headache, fatigue may be related to anemia. COVID/ Flu pending   Final Clinical Impressions(s) / UC Diagnoses   Final diagnoses:  Acute non-recurrent pansinusitis  Encounter for screening for COVID-19     Discharge Instructions      Your COVID 19 results should result within  3-5 days. Negative results are immediately resulted to Mychart. Positive results will receive a follow-up call from our clinic. If symptoms are present, I recommend home quarantine until results are known.  Alternate Tylenol and ibuprofen as needed for body aches and fever.  Symptom management per recommendations discussed today.  If any  breathing difficulty or chest pain develops go immediately to the closest emergency department for evaluation.     ED Prescriptions     Medication Sig Dispense Auth. Provider   amoxicillin (AMOXIL) 875 MG tablet Take 1 tablet (875 mg total) by mouth 2 (two) times daily for 10 days. 20 tablet Bing Neighbors, FNP   predniSONE (DELTASONE) 20 MG tablet Take 1 tablet (20 mg total) by mouth daily with breakfast for 5 days. 5 tablet Bing Neighbors, FNP      PDMP not reviewed this encounter.   Bing Neighbors, FNP 11/03/20 1243

## 2020-11-02 ENCOUNTER — Emergency Department (HOSPITAL_COMMUNITY)
Admission: EM | Admit: 2020-11-02 | Discharge: 2020-11-02 | Disposition: A | Discharge status: Home / Self Care | Payer: BC Managed Care – PPO | Attending: Emergency Medicine | Admitting: Emergency Medicine

## 2020-11-02 ENCOUNTER — Encounter (HOSPITAL_COMMUNITY): Payer: Self-pay | Admitting: *Deleted

## 2020-11-02 ENCOUNTER — Emergency Department (HOSPITAL_COMMUNITY): Payer: BC Managed Care – PPO

## 2020-11-02 ENCOUNTER — Other Ambulatory Visit: Payer: Self-pay

## 2020-11-02 DIAGNOSIS — N938 Other specified abnormal uterine and vaginal bleeding: Secondary | ICD-10-CM | POA: Insufficient documentation

## 2020-11-02 DIAGNOSIS — D649 Anemia, unspecified: Secondary | ICD-10-CM | POA: Insufficient documentation

## 2020-11-02 LAB — CBC WITH DIFFERENTIAL/PLATELET
Abs Immature Granulocytes: 0.01 10*3/uL (ref 0.00–0.07)
Basophils Absolute: 0 10*3/uL (ref 0.0–0.1)
Basophils Relative: 1 %
Eosinophils Absolute: 0 10*3/uL (ref 0.0–0.5)
Eosinophils Relative: 0 %
HCT: 27.7 % — ABNORMAL LOW (ref 36.0–46.0)
Hemoglobin: 7.9 g/dL — ABNORMAL LOW (ref 12.0–15.0)
Immature Granulocytes: 0 %
Lymphocytes Relative: 26 %
Lymphs Abs: 1.3 10*3/uL (ref 0.7–4.0)
MCH: 18.8 pg — ABNORMAL LOW (ref 26.0–34.0)
MCHC: 28.5 g/dL — ABNORMAL LOW (ref 30.0–36.0)
MCV: 65.8 fL — ABNORMAL LOW (ref 80.0–100.0)
Monocytes Absolute: 0.3 10*3/uL (ref 0.1–1.0)
Monocytes Relative: 6 %
Neutro Abs: 3.3 10*3/uL (ref 1.7–7.7)
Neutrophils Relative %: 67 %
Platelets: 350 10*3/uL (ref 150–400)
RBC: 4.21 MIL/uL (ref 3.87–5.11)
RDW: 20.2 % — ABNORMAL HIGH (ref 11.5–15.5)
WBC: 5 10*3/uL (ref 4.0–10.5)
nRBC: 0 % (ref 0.0–0.2)

## 2020-11-02 LAB — BASIC METABOLIC PANEL
Anion gap: 5 (ref 5–15)
BUN: 10 mg/dL (ref 6–20)
CO2: 25 mmol/L (ref 22–32)
Calcium: 8.9 mg/dL (ref 8.9–10.3)
Chloride: 107 mmol/L (ref 98–111)
Creatinine, Ser: 0.72 mg/dL (ref 0.44–1.00)
GFR, Estimated: >60 mL/min (ref >60)
Glucose, Bld: 90 mg/dL (ref 70–99)
Potassium: 3.4 mmol/L — ABNORMAL LOW (ref 3.5–5.1)
Sodium: 137 mmol/L (ref 135–145)

## 2020-11-02 LAB — COVID-19, FLU A+B NAA
Influenza A, NAA: NOT DETECTED
Influenza B, NAA: NOT DETECTED
SARS-CoV-2, NAA: NOT DETECTED

## 2020-11-02 LAB — PREPARE RBC (CROSSMATCH)

## 2020-11-02 LAB — I-STAT BETA HCG BLOOD, ED (MC, WL, AP ONLY): I-stat hCG, quantitative: <5 m[IU]/mL (ref <5)

## 2020-11-02 MED ORDER — SODIUM CHLORIDE 0.9 % IV SOLN
10.0000 mL/h | Freq: Once | INTRAVENOUS | Status: AC
  Administered 2020-11-02: 10 mL/h via INTRAVENOUS

## 2020-11-02 MED ORDER — FERROUS SULFATE 325 (65 FE) MG PO TABS: 325.0000 mg | ORAL_TABLET | Freq: Every day | ORAL | 0 refills | Status: DC

## 2020-11-02 NOTE — ED Notes (Signed)
Transfusion complete

## 2020-11-02 NOTE — ED Triage Notes (Signed)
Pt c/o SOB at rest and worse with exertion.  Seen at urgent Care yesterday and was told to f/u with her doctor, pt not able to see anytime soon.  Pt states she feels like she did when her "blood' was low and had  to get a transfusion.

## 2020-11-02 NOTE — ED Provider Notes (Addendum)
[facility] EMERGENCY DEPARTMENT Provider Note   CSN: [id number] Arrival date & time: 11/02/20  1133     History Chief Complaint  Patient presents with   Shortness of Breath    Raven Smith is a 50 y.o. female.  Pt presents to the ED today with sob.  She said she feels like she did when her hemoglobin was low and she had to receive a blood transfusion.  This was on 01/09/20.  She has a hx of heavy periods and has her period about 1 week ago.  She was supposed to f/u with gyn, but has not done so.  She did see GI and had an endoscopy and colonoscopy:  EGD completed 02/09/2020 which found small hiatal hernia, gastritis status post biopsy, normal duodenum status post biopsy.  Surgical pathology found the gastric biopsies to be benign gastric mucosa without H. pylori and the duodenal biopsies to be benign duodenal mucosa.  Recommended continue current medications and follow-up in the GI clinic as needed.   Colonoscopy the same day found nonbleeding internal hemorrhoids, normal ileum, otherwise normal.  Recommended repeat colonoscopy in 10 years.  She did go to UC yesterday.  They did a Covid swab which is still pending.  They also gave her a rx for a sinus infection.        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   Hyperlipidemia 2014    Past Surgical History:  Procedure Laterality Date   BIOPSY  02/09/2020    Procedure: BIOPSY;  Surgeon: Lanelle Bal, DO;  Location: [facility];  Service: Endoscopy;;   CHOLECYSTECTOMY     COLONOSCOPY WITH PROPOFOL N/A 02/09/2020   Procedure: COLONOSCOPY WITH PROPOFOL;  Surgeon: Lanelle Bal, DO;  Location: [facility];  Service: Endoscopy;  Laterality: N/A;  8:00am   ESOPHAGOGASTRODUODENOSCOPY (EGD) WITH PROPOFOL N/A 02/09/2020   Procedure: ESOPHAGOGASTRODUODENOSCOPY (EGD) WITH PROPOFOL;  Surgeon: Lanelle Bal, DO;  Location: [facility];  Service: Endoscopy;  Laterality: N/A;   TUBAL LIGATION       OB History     Gravida  4   Para  2   Term      Preterm  2   AB  2   Living  1      SAB  2   IAB      Ectopic      Multiple      Live Births  2           Family History  Problem Relation Age of Onset   Fibroids Mother    Diabetes Mother    Diabetes Father    Diabetes Maternal Grandmother    Heart failure Maternal Grandmother    Other Son        MVA   Colon cancer Neg Hx    Gastric cancer Neg Hx     Social History  Tobacco Use   Smoking status: Never   Smokeless tobacco: Never  Vaping Use   Vaping Use: Never used  Substance Use Topics   Alcohol use: Yes    Comment: occasional   Drug use: No    Home Medications Prior to Admission medications   Medication Sig Start Date End Date Taking? Authorizing Provider  ferrous sulfate 325 (65 FE) MG tablet Take 1 tablet (325 mg total) by mouth daily. 11/02/20  Yes Jacalyn Lefevre, MD  amoxicillin (AMOXIL) 875 MG tablet Take 1 tablet (875 mg total) by mouth 2 (two) times daily for 10 days. 11/01/20 11/11/20  Bing Neighbors, FNP  loratadine (CLARITIN) 10 MG tablet Take 10 mg by mouth daily as needed for allergies.    [provider]  pantoprazole (PROTONIX) 40 MG tablet Take 1 tablet (40 mg total) by mouth daily. 04/20/20   Anice Paganini, NP  predniSONE (DELTASONE) 20 MG tablet Take 1 tablet (20 mg total) by mouth daily with breakfast for 5 days. 11/01/20  11/06/20  Bing Neighbors, FNP  Vitamin D, Ergocalciferol, (DRISDOL) 1.25 MG (50000 UNIT) CAPS capsule Take 1 capsule by mouth once a week. 10/02/19   [provider]    Allergies    Patient has no known allergies.  Review of Systems   Review of Systems  Respiratory:  Positive for shortness of breath.   Neurological:  Positive for weakness.  All other systems reviewed and are negative.  Physical Exam Updated Vital Signs BP 120/82 (BP Location: Right Arm)   Pulse 92   Temp 97.6 F (36.4 C) (Oral)   Resp 17   Ht 5\' 9"  (1.753 m)   Wt 72.6 kg   LMP 10/26/2020   SpO2 100%   BMI 23.63 kg/m   Physical Exam Vitals and nursing note reviewed.  Constitutional:      Appearance: She is well-developed.  HENT:     Head: Normocephalic and atraumatic.     Mouth/Throat:     Mouth: Mucous membranes are moist.     Pharynx: Oropharynx is clear.  Eyes:     Extraocular Movements: Extraocular movements intact.     Pupils: Pupils are equal, round, and reactive to light.  Cardiovascular:     Rate and Rhythm: Normal rate and regular rhythm.  Pulmonary:     Effort: Pulmonary effort is normal.     Breath sounds: Normal breath sounds.  Abdominal:     General: Bowel sounds are normal.     Palpations: Abdomen is soft.  Musculoskeletal:        General: Normal range of motion.     Cervical back: Normal range of motion and neck supple.  Skin:    General: Skin is warm.     Capillary Refill: Capillary refill takes less than 2 seconds.  Neurological:     General: No focal deficit present.     Mental Status: She is alert and oriented to person, place, and time.  Psychiatric:        Mood and Affect: Mood normal.        Behavior: Behavior normal.    ED Results / Procedures / Treatments   Labs (all labs ordered are listed, but only abnormal results are displayed) Labs Reviewed  CBC WITH DIFFERENTIAL/PLATELET - Abnormal; Notable for the following components:      Result Value    Hemoglobin 7.9 (*)    HCT 27.7 (*)    MCV 65.8 (*)    MCH 18.8 (*)  MCHC 28.5 (*)    RDW 20.2 (*)    All other components within normal limits  BASIC METABOLIC PANEL - Abnormal; Notable for the following components:   Potassium 3.4 (*)    All other components within normal limits  I-STAT BETA HCG BLOOD, ED ([facility], [facility], [facility] ONLY)  TYPE AND SCREEN  PREPARE RBC (CROSSMATCH)    EKG None  Radiology DG Chest Portable 1 View  Result Date: 11/02/2020 CLINICAL DATA:  Shortness of breath EXAM: PORTABLE CHEST 1 VIEW COMPARISON:  12/02/2018 FINDINGS: The heart size and mediastinal contours are within normal limits. Both lungs are clear. The visualized skeletal structures are unremarkable. IMPRESSION: No acute abnormality of the lungs in [facility] portable projection. Electronically Signed   By: Lauralyn Primes M.D.   On: 11/02/2020 12:29    Procedures Procedures   Medications Ordered in ED Medications  0.9 %  sodium chloride infusion (has no administration in time range)    ED Course  I have reviewed the triage vital signs and the nursing notes.  Pertinent labs & imaging results that were available during my care of the patient were reviewed by me and considered in my medical decision making (see chart for details).    MDM Rules/Calculators/A&P                          She has been told to take [facility] for her DUB.  She has some at home, she just has not started taking it.  She is anemic and symptomatic from it.  She will get 1 unit of blood in the ED and then will be d/c home.  She will also need to go on iron.  She knows to f/u with gyn.  Return if worse.  CRITICAL CARE Performed by: Jacalyn Lefevre   Total critical care time: 30 minutes  Critical care time was exclusive of separately billable procedures and treating other patients.  Critical care was necessary to treat or prevent imminent or life-threatening deterioration.  Critical care was time spent personally by me on the following  activities: development of treatment plan with patient and/or surrogate as well as nursing, discussions with consultants, evaluation of patient's response to treatment, examination of patient, obtaining history from patient or surrogate, ordering and performing treatments and interventions, ordering and review of laboratory studies, ordering and review of radiographic studies, pulse oximetry and re-evaluation of patient's condition.   Final Clinical Impression(s) / ED Diagnoses Final diagnoses:  Symptomatic anemia  DUB (dysfunctional uterine bleeding)    Rx / DC Orders ED Discharge Orders          Ordered    ferrous sulfate 325 (65 FE) MG tablet  Daily        11/02/20 1348             Jacalyn Lefevre, MD 11/02/20 1351    Jacalyn Lefevre, MD 11/16/20 1719

## 2020-11-03 LAB — BPAM RBC
Blood Product Expiration Date: 202207262359
ISSUE DATE / TIME: 202206211509
Unit Type and Rh: 1700

## 2020-11-03 LAB — TYPE AND SCREEN
ABO/RH(D): B POS
Antibody Screen: NEGATIVE
Unit division: 0

## 2020-11-11 DIAGNOSIS — R5383 Other fatigue: Secondary | ICD-10-CM | POA: Diagnosis not present

## 2020-11-11 DIAGNOSIS — M62830 Muscle spasm of back: Secondary | ICD-10-CM | POA: Diagnosis not present

## 2020-11-11 DIAGNOSIS — R002 Palpitations: Secondary | ICD-10-CM | POA: Diagnosis not present

## 2020-11-11 DIAGNOSIS — J329 Chronic sinusitis, unspecified: Secondary | ICD-10-CM | POA: Diagnosis not present

## 2020-11-11 DIAGNOSIS — E049 Nontoxic goiter, unspecified: Secondary | ICD-10-CM | POA: Diagnosis not present

## 2020-11-11 DIAGNOSIS — J40 Bronchitis, not specified as acute or chronic: Secondary | ICD-10-CM | POA: Diagnosis not present

## 2020-11-11 DIAGNOSIS — I10 Essential (primary) hypertension: Secondary | ICD-10-CM | POA: Diagnosis not present

## 2020-11-11 DIAGNOSIS — Z1331 Encounter for screening for depression: Secondary | ICD-10-CM | POA: Diagnosis not present

## 2020-11-11 DIAGNOSIS — Z0001 Encounter for general adult medical examination with abnormal findings: Secondary | ICD-10-CM | POA: Diagnosis not present

## 2020-11-11 DIAGNOSIS — N920 Excessive and frequent menstruation with regular cycle: Secondary | ICD-10-CM | POA: Diagnosis not present

## 2020-11-11 DIAGNOSIS — Z1389 Encounter for screening for other disorder: Secondary | ICD-10-CM | POA: Diagnosis not present

## 2020-11-11 DIAGNOSIS — Z6823 Body mass index (BMI) 23.0-23.9, adult: Secondary | ICD-10-CM | POA: Diagnosis not present

## 2020-11-11 DIAGNOSIS — D509 Iron deficiency anemia, unspecified: Secondary | ICD-10-CM | POA: Diagnosis not present

## 2020-11-12 ENCOUNTER — Other Ambulatory Visit (HOSPITAL_COMMUNITY): Payer: Self-pay | Admitting: Internal Medicine

## 2020-11-12 DIAGNOSIS — Z1231 Encounter for screening mammogram for malignant neoplasm of breast: Secondary | ICD-10-CM

## 2020-11-12 DIAGNOSIS — E049 Nontoxic goiter, unspecified: Secondary | ICD-10-CM

## 2020-11-23 ENCOUNTER — Other Ambulatory Visit: Payer: Self-pay

## 2020-11-23 ENCOUNTER — Ambulatory Visit (HOSPITAL_COMMUNITY)
Admission: RE | Admit: 2020-11-23 | Discharge: 2020-11-23 | Disposition: A | Discharge status: Home / Self Care | Payer: BC Managed Care – PPO | Source: Ambulatory Visit | Attending: Internal Medicine | Admitting: Internal Medicine

## 2020-11-23 DIAGNOSIS — E049 Nontoxic goiter, unspecified: Secondary | ICD-10-CM

## 2020-11-23 DIAGNOSIS — E042 Nontoxic multinodular goiter: Secondary | ICD-10-CM | POA: Diagnosis not present

## 2020-11-24 ENCOUNTER — Ambulatory Visit (HOSPITAL_COMMUNITY): Payer: Self-pay

## 2020-12-13 DIAGNOSIS — Z681 Body mass index (BMI) 19 or less, adult: Secondary | ICD-10-CM | POA: Diagnosis not present

## 2020-12-13 DIAGNOSIS — M5410 Radiculopathy, site unspecified: Secondary | ICD-10-CM | POA: Diagnosis not present

## 2020-12-13 DIAGNOSIS — M5136 Other intervertebral disc degeneration, lumbar region: Secondary | ICD-10-CM | POA: Diagnosis not present

## 2020-12-15 ENCOUNTER — Telehealth: Payer: Self-pay

## 2020-12-15 NOTE — Telephone Encounter (Signed)
Received a PA request for Raven Smith, but unsure if pt is still taking this medication. Called pt, no answer, vm full.

## 2020-12-22 ENCOUNTER — Other Ambulatory Visit (HOSPITAL_COMMUNITY): Payer: Self-pay | Admitting: Family Medicine

## 2020-12-22 ENCOUNTER — Other Ambulatory Visit: Payer: Self-pay | Admitting: Family Medicine

## 2020-12-22 DIAGNOSIS — M5136 Other intervertebral disc degeneration, lumbar region: Secondary | ICD-10-CM

## 2020-12-22 DIAGNOSIS — M5416 Radiculopathy, lumbar region: Secondary | ICD-10-CM

## 2021-01-04 ENCOUNTER — Other Ambulatory Visit: Payer: Self-pay

## 2021-01-04 ENCOUNTER — Ambulatory Visit (HOSPITAL_COMMUNITY)
Admission: RE | Admit: 2021-01-04 | Discharge: 2021-01-04 | Disposition: A | Discharge status: Home / Self Care | Payer: BC Managed Care – PPO | Source: Ambulatory Visit | Attending: Family Medicine | Admitting: Family Medicine

## 2021-01-04 DIAGNOSIS — M5136 Other intervertebral disc degeneration, lumbar region: Secondary | ICD-10-CM | POA: Diagnosis not present

## 2021-01-04 DIAGNOSIS — M5416 Radiculopathy, lumbar region: Secondary | ICD-10-CM | POA: Diagnosis not present

## 2021-01-04 DIAGNOSIS — M545 Low back pain, unspecified: Secondary | ICD-10-CM | POA: Diagnosis not present

## 2021-01-26 DIAGNOSIS — M461 Sacroiliitis, not elsewhere classified: Secondary | ICD-10-CM | POA: Diagnosis not present

## 2021-02-10 DIAGNOSIS — M461 Sacroiliitis, not elsewhere classified: Secondary | ICD-10-CM | POA: Diagnosis not present

## 2021-03-17 ENCOUNTER — Ambulatory Visit: Payer: BC Managed Care – PPO | Admitting: Adult Health

## 2021-03-17 ENCOUNTER — Encounter: Payer: Self-pay | Admitting: Adult Health

## 2021-03-17 ENCOUNTER — Other Ambulatory Visit: Payer: Self-pay

## 2021-03-17 ENCOUNTER — Other Ambulatory Visit: Payer: Self-pay | Admitting: Adult Health

## 2021-03-17 VITALS — BP 121/73 | HR 104 | Ht 69.0 in | Wt 160.6 lb

## 2021-03-17 DIAGNOSIS — N921 Excessive and frequent menstruation with irregular cycle: Secondary | ICD-10-CM

## 2021-03-17 DIAGNOSIS — D500 Iron deficiency anemia secondary to blood loss (chronic): Secondary | ICD-10-CM

## 2021-03-17 LAB — POCT HEMOGLOBIN: Hemoglobin: 7.7 g/dL — AB (ref 11–14.6)

## 2021-03-17 MED ORDER — MEGESTROL ACETATE 40 MG PO TABS: ORAL_TABLET | ORAL | 1 refills | Status: DC

## 2021-03-17 NOTE — Progress Notes (Signed)
  Subjective:     Patient ID: Raven Smith, female   DOB: 1970/09/08, 50 y.o.   MRN: [id number]  HPI Raven Smith is a 50 year old black female,married, [id number], in complaining of heavy periods that are irregular. Was seen in ER in June for bleeding had to get blood. She had 2 periods in October, small clots, no pain, has been tired with some dizziness. Has had decreased libido and feels dry. Son killed in MVA in 2020 and has not been her self since. She has not been taking iron.  PCP is Raven Smith.  Lab Results  Component Value Date   DIAGPAP  12/25/2018    NEGATIVE FOR INTRAEPITHELIAL LESIONS OR MALIGNANCY.   HPV NOT Detected 12/25/2018    Review of Systems Patient denies any headaches, hearing loss, fatigue, blurred vision, shortness of breath, chest pain, abdominal pain, problems with bowel movements, urination.  No joint pain or mood swings.    See HPI for positives.  Reviewed past medical,surgical, social and family history. Reviewed medications and allergies.  Objective:   Physical Exam BP 121/73 (BP Location: Right Arm, Patient Position: Sitting, Cuff Size: Normal)   Pulse (!) 104   Ht 5\' 9"  (1.753 m)   Wt 160 lb 9.6 oz (72.8 kg)   LMP 03/14/2021 (Exact Date)   BMI 23.72 kg/m  POC HGB 7.7. Skin warm and dry.  Lungs: clear to ausculation bilaterally. Cardiovascular: regular rate and rhythm.    Pelvic: external genitalia is normal in appearance no lesions, vagina: +period blood,urethra has no lesions or masses noted, cervix:smooth and bulbous, uterus: normal size, shape and contour, non tender, no masses felt, adnexa: no masses or tenderness noted. Bladder is non tender and no masses felt.  Fall risk is low  Upstream - 03/17/21 1100       Pregnancy Intention Screening   Does the patient want to become pregnant in the next year? N/A    Does the patient's partner want to become pregnant in the next year? N/A    Would the patient like to discuss contraceptive options today? N/A       Contraception Wrap Up   Current Method Female Sterilization    End Method Female Sterilization    Contraception Counseling Provided No            Examination chaperoned by 13/03/22  Assessment:     1. Menorrhagia with irregular cycle Will check labs and get pelvic Psychologist, prison and probation services, scheduled [place] at [facility] for 03/23/21 at 2:30 pm  Will rx megace to stop bleeding Meds ordered this encounter  Medications   megestrol (MEGACE) 40 MG tablet    Sig: Take 3 x 5 days then 2 x 5 days then 1 daily til bleeding stops    Dispense:  45 tablet    Refill:  1    Order Specific Question:   Supervising Provider    Answer:   13/9/22, LUTHER H [[id number]]    - POCT hemoglobin - CBC - TSH - Iron, TIBC and Ferritin Panel - [place] PELVIC COMPLETE WITH TRANSVAGINAL; Future  2. Iron deficiency anemia due to chronic blood loss Take 2 iron tablets daily, can use senokot if constipation  - CBC - Iron, TIBC and Ferritin Panel     Plan:     Follow up with me in 2 weeks

## 2021-03-18 ENCOUNTER — Telehealth: Payer: Self-pay | Admitting: Adult Health

## 2021-03-18 NOTE — Telephone Encounter (Signed)
Pt went to [facility] for labs,instead of [facility] and they did labs I did not order and missed doing TSH, pt aware HGB 8.8 so take 2 iron tabs and ferritin 3 iron 25, bleeding has stopped. continue megace.has appt 03/31/21.

## 2021-03-22 ENCOUNTER — Ambulatory Visit (HOSPITAL_COMMUNITY): Payer: BC Managed Care – PPO

## 2021-03-22 LAB — COMPLETE METABOLIC PANEL WITH GFR
AG Ratio: 1.4 (calc) (ref 1.0–2.5)
ALT: 10 U/L (ref 6–29)
AST: 11 U/L (ref 10–35)
Albumin: 4.2 g/dL (ref 3.6–5.1)
Alkaline phosphatase (APISO): 51 U/L (ref 31–125)
BUN: 11 mg/dL (ref 7–25)
CO2: 26 mmol/L (ref 20–32)
Calcium: 9.1 mg/dL (ref 8.6–10.2)
Chloride: 102 mmol/L (ref 98–110)
Creat: 0.93 mg/dL (ref 0.50–0.99)
Globulin: 3.1 g/dL (calc) (ref 1.9–3.7)
Glucose, Bld: 78 mg/dL (ref 65–99)
Potassium: 4 mmol/L (ref 3.5–5.3)
Sodium: 136 mmol/L (ref 135–146)
Total Bilirubin: 0.5 mg/dL (ref 0.2–1.2)
Total Protein: 7.3 g/dL (ref 6.1–8.1)
eGFR: 76 mL/min/{1.73_m2} (ref >=60)

## 2021-03-22 LAB — CBC WITH DIFFERENTIAL/PLATELET
Absolute Monocytes: 454 cells/uL (ref 200–950)
Basophils Absolute: 16 cells/uL (ref 0–200)
Basophils Relative: 0.2 %
Eosinophils Absolute: 8 cells/uL — ABNORMAL LOW (ref 15–500)
Eosinophils Relative: 0.1 %
HCT: 30 % — ABNORMAL LOW (ref 35.0–45.0)
Hemoglobin: 8.8 g/dL — ABNORMAL LOW (ref 11.7–15.5)
Lymphs Abs: 1580 cells/uL (ref 850–3900)
MCH: 19.3 pg — ABNORMAL LOW (ref 27.0–33.0)
MCHC: 29.3 g/dL — ABNORMAL LOW (ref 32.0–36.0)
MCV: 65.9 fL — ABNORMAL LOW (ref 80.0–100.0)
MPV: 10.6 fL (ref 7.5–12.5)
Monocytes Relative: 5.6 %
Neutro Abs: 6043 cells/uL (ref 1500–7800)
Neutrophils Relative %: 74.6 %
Platelets: 316 10*3/uL (ref 140–400)
RBC: 4.55 10*6/uL (ref 3.80–5.10)
RDW: 17.7 % — ABNORMAL HIGH (ref 11.0–15.0)
Total Lymphocyte: 19.5 %
WBC: 8.1 10*3/uL (ref 3.8–10.8)

## 2021-03-22 LAB — TSH: TSH: 0.89 mIU/L

## 2021-03-22 LAB — TEST AUTHORIZATION

## 2021-03-22 LAB — IRON,TIBC AND FERRITIN PANEL
%SAT: 5 % (calc) — ABNORMAL LOW (ref 16–45)
Ferritin: 3 ng/mL — ABNORMAL LOW (ref 16–232)
Iron: 25 ug/dL — ABNORMAL LOW (ref 40–190)
TIBC: 478 mcg/dL (calc) — ABNORMAL HIGH (ref 250–450)

## 2021-03-22 LAB — CBC MORPHOLOGY

## 2021-03-23 ENCOUNTER — Other Ambulatory Visit: Payer: Self-pay

## 2021-03-23 ENCOUNTER — Ambulatory Visit (HOSPITAL_COMMUNITY)
Admission: RE | Admit: 2021-03-23 | Discharge: 2021-03-23 | Disposition: A | Discharge status: Home / Self Care | Payer: BC Managed Care – PPO | Source: Ambulatory Visit | Attending: Adult Health | Admitting: Adult Health

## 2021-03-23 DIAGNOSIS — N921 Excessive and frequent menstruation with irregular cycle: Secondary | ICD-10-CM | POA: Diagnosis not present

## 2021-03-23 DIAGNOSIS — N920 Excessive and frequent menstruation with regular cycle: Secondary | ICD-10-CM | POA: Diagnosis not present

## 2021-03-31 ENCOUNTER — Other Ambulatory Visit: Payer: Self-pay

## 2021-03-31 ENCOUNTER — Encounter: Payer: Self-pay | Admitting: Adult Health

## 2021-03-31 ENCOUNTER — Ambulatory Visit: Payer: BC Managed Care – PPO | Admitting: Adult Health

## 2021-03-31 VITALS — BP 130/78 | HR 92 | Ht 69.0 in | Wt 160.8 lb

## 2021-03-31 DIAGNOSIS — N921 Excessive and frequent menstruation with irregular cycle: Secondary | ICD-10-CM | POA: Diagnosis not present

## 2021-03-31 DIAGNOSIS — D500 Iron deficiency anemia secondary to blood loss (chronic): Secondary | ICD-10-CM | POA: Diagnosis not present

## 2021-03-31 MED ORDER — MEGESTROL ACETATE 40 MG PO TABS: ORAL_TABLET | ORAL | 3 refills | Status: DC

## 2021-03-31 NOTE — Progress Notes (Signed)
  Subjective:     Patient ID: Raven Smith, female   DOB: Oct 26, 1970, 50 y.o.   MRN: [id number]  HPI Raven Smith is a 50 year old black female, married, [id number], back in follow up on bleeding heavy and it stopped with megace. [place] was normal on 03/23/21 and HGB was 8.8 on 03/17/21 and ferratin was 3 and iron was 25. She is tired and has vaginal dryness and pain with sex at ttmes. PCP is Raven Smith.  Lab Results  Component Value Date   DIAGPAP  12/25/2018    NEGATIVE FOR INTRAEPITHELIAL LESIONS OR MALIGNANCY.   HPV NOT Detected 12/25/2018    Review of Systems Bleeding has stopped +tired +vaginal dryness and pain with sex at times Reviewed past medical,surgical, social and family history. Reviewed medications and allergies.     Objective:   Physical Exam BP 130/78 (BP Location: Right Arm, Patient Position: Sitting, Cuff Size: Normal)   Pulse 92   Ht 5\' 9"  (1.753 m)   Wt 160 lb 12.8 oz (72.9 kg)   LMP 03/14/2021 (Exact Date)   BMI 23.75 kg/m  Skin warm and dry.Lungs: clear to ausculation bilaterally. Cardiovascular: regular rate and rhythm.      Upstream - 03/31/21 1116       Pregnancy Intention Screening   Does the patient want to become pregnant in the next year? N/A    Does the patient's partner want to become pregnant in the next year? N/A    Would the patient like to discuss contraceptive options today? N/A      Contraception Wrap Up   Current Method Female Sterilization    End Method Female Sterilization    Contraception Counseling Provided No             Assessment:     1. Menorrhagia with irregular cycle Continue megace Meds ordered this encounter  Medications   megestrol (MEGACE) 40 MG tablet    Sig: Take 1 daily    Dispense:  30 tablet    Refill:  3    Order Specific Question:   Supervising Provider    Answer:   04/02/21, Raven Smith [[id number]]     2. Iron deficiency anemia due to chronic blood loss Continue iron po Will set up for iron infusion, Ferahem 510 mg IV and  repeat in 1 week, orders faxed     Plan:     Has physical 04/25/21

## 2021-04-14 ENCOUNTER — Encounter (HOSPITAL_COMMUNITY)
Admission: RE | Admit: 2021-04-14 | Discharge: 2021-04-14 | Disposition: A | Discharge status: Home / Self Care | Payer: BC Managed Care – PPO | Source: Ambulatory Visit | Attending: Adult Health | Admitting: Adult Health

## 2021-04-14 ENCOUNTER — Other Ambulatory Visit: Payer: Self-pay

## 2021-04-14 DIAGNOSIS — D649 Anemia, unspecified: Secondary | ICD-10-CM | POA: Diagnosis not present

## 2021-04-14 MED ORDER — SODIUM CHLORIDE 0.9 % IV SOLN: Freq: Once | INTRAVENOUS | Status: AC

## 2021-04-14 MED ORDER — SODIUM CHLORIDE 0.9 % IV SOLN
510.0000 mg | Freq: Once | INTRAVENOUS | Status: AC
  Administered 2021-04-14: 510 mg via INTRAVENOUS
  Filled 2021-04-14: qty 17

## 2021-04-21 ENCOUNTER — Encounter (HOSPITAL_COMMUNITY)
Admission: RE | Admit: 2021-04-21 | Discharge: 2021-04-21 | Disposition: A | Discharge status: Home / Self Care | Payer: BC Managed Care – PPO | Source: Ambulatory Visit | Attending: Adult Health | Admitting: Adult Health

## 2021-04-21 ENCOUNTER — Encounter (HOSPITAL_COMMUNITY): Payer: Self-pay

## 2021-04-21 DIAGNOSIS — D649 Anemia, unspecified: Secondary | ICD-10-CM | POA: Diagnosis not present

## 2021-04-21 MED ORDER — SODIUM CHLORIDE 0.9 % IV SOLN: Freq: Once | INTRAVENOUS | Status: AC

## 2021-04-21 MED ORDER — SODIUM CHLORIDE 0.9 % IV SOLN
510.0000 mg | Freq: Once | INTRAVENOUS | Status: AC
  Administered 2021-04-21: 510 mg via INTRAVENOUS
  Filled 2021-04-21: qty 510

## 2021-04-25 ENCOUNTER — Ambulatory Visit (INDEPENDENT_AMBULATORY_CARE_PROVIDER_SITE_OTHER): Payer: BC Managed Care – PPO | Admitting: Adult Health

## 2021-04-25 ENCOUNTER — Encounter: Payer: Self-pay | Admitting: Adult Health

## 2021-04-25 ENCOUNTER — Other Ambulatory Visit: Payer: Self-pay

## 2021-04-25 ENCOUNTER — Other Ambulatory Visit (HOSPITAL_COMMUNITY)
Admission: RE | Admit: 2021-04-25 | Discharge: 2021-04-25 | Disposition: A | Discharge status: Home / Self Care | Payer: BC Managed Care – PPO | Source: Ambulatory Visit | Attending: Adult Health | Admitting: Adult Health

## 2021-04-25 VITALS — BP 112/78 | HR 101 | Ht 69.0 in | Wt 159.0 lb

## 2021-04-25 DIAGNOSIS — N941 Unspecified dyspareunia: Secondary | ICD-10-CM | POA: Diagnosis not present

## 2021-04-25 DIAGNOSIS — N921 Excessive and frequent menstruation with irregular cycle: Secondary | ICD-10-CM

## 2021-04-25 DIAGNOSIS — D500 Iron deficiency anemia secondary to blood loss (chronic): Secondary | ICD-10-CM

## 2021-04-25 DIAGNOSIS — Z1211 Encounter for screening for malignant neoplasm of colon: Secondary | ICD-10-CM | POA: Diagnosis not present

## 2021-04-25 DIAGNOSIS — Z01419 Encounter for gynecological examination (general) (routine) without abnormal findings: Secondary | ICD-10-CM

## 2021-04-25 DIAGNOSIS — N898 Other specified noninflammatory disorders of vagina: Secondary | ICD-10-CM

## 2021-04-25 LAB — POCT URINALYSIS DIPSTICK OB
Glucose, UA: NEGATIVE
Ketones, UA: NEGATIVE
Nitrite, UA: NEGATIVE
POC,PROTEIN,UA: NEGATIVE

## 2021-04-25 LAB — HEMOCCULT GUIAC POC 1CARD (OFFICE): Fecal Occult Blood, POC: NEGATIVE

## 2021-04-25 NOTE — Progress Notes (Signed)
Patient ID: Raven Smith, female   DOB: May 20, 1970, 50 y.o.   MRN: [id number] History of Present Illness:  Raven Smith is a 50 year old black female,married, [id number], in for a well woman gyn exam and pap. She was having heavy bleeding and megace stopped and she has gotten iron infusion, feels a little better. She is having vaginal dryness.  PCP is [place].  Current Medications, Allergies, Past Medical History, Past Surgical History, Family History and Social History were reviewed in [employer] record.     Review of Systems:  Patient denies any headaches, hearing loss,  blurred vision, shortness of breath, chest pain, abdominal pain, problems with bowel movements, urination, or intercourse. No joint pain or mood swings.  Bleeding has stopped Still tired +vaginal dryness  Decrease libido   Physical Exam:BP 112/78 (BP Location: Left Arm, Patient Position: Sitting, Cuff Size: Normal)   Pulse (!) 101   Ht 5\' 9"  (1.753 m)   Wt 159 lb (72.1 kg)   LMP  (LMP Unknown)   BMI 23.48 kg/m  urine +leuks General:  Well developed, well nourished, no acute distress Skin:  Warm and dry Neck:  Midline trachea, normal thyroid, good ROM, no lymphadenopathy Lungs; Clear to auscultation bilaterally Breast:  No dominant palpable mass, retraction, or nipple discharge Cardiovascular: Regular rate and rhythm Abdomen:  Soft, non tender, no hepatosplenomegaly Pelvic:  External genitalia is normal in appearance, no lesions.  The vagina is normal in appearance. Urethra has no lesions or masses. The cervix is bulbous.Pap with GC/CHL and HR HPV genotyping performed.  Uterus is felt to be normal size, shape, and contour.  No adnexal masses or tenderness noted.Bladder is non tender, no masses felt. Rectal: Good sphincter tone, no polyps, or hemorrhoids felt.  Hemoccult negative. Extremities/musculoskeletal:  No swelling or varicosities noted, no clubbing or cyanosis Psych:  No mood changes, alert  and cooperative,seems happy AA is 1 Fall risk is low Depression screen [facility] 2/9 04/25/2021 12/25/2018  Decreased Interest 2 3  Down, Depressed, Hopeless 2 3  PHQ - 2 Score 4 6  Altered sleeping 2 2  Tired, decreased energy 3 3  Change in appetite 2 3  Feeling bad or failure about yourself  2 2  Trouble concentrating 1 1  Moving slowly or fidgety/restless 1 1  Suicidal thoughts 0 0  PHQ-9 Score 15 18   She declines meds  GAD 7 : Generalized Anxiety Score 04/25/2021  Nervous, Anxious, on Edge 2  Control/stop worrying 2  Worry too much - different things 2  Trouble relaxing 2  Restless 1  Easily annoyed or irritable 1  Afraid - awful might happen 1  Total GAD 7 Score 11      Upstream - 04/25/21 1531       Pregnancy Intention Screening   Does the patient want to become pregnant in the next year? No    Does the patient's partner want to become pregnant in the next year? No    Would the patient like to discuss contraceptive options today? No      Contraception Wrap Up   Current Method Female Sterilization    End Method Female Sterilization    Contraception Counseling Provided No            Examination chaperoned by 14/12/22 RN  Impression and Plan: 1. Encounter for gynecological examination with Papanicolaou smear of cervix Pap sent Physical in 1 year Pap in 3 if normal Colonoscopy per GI  Get  mammogram   2. Encounter for screening fecal occult blood testing   3. Menorrhagia with irregular cycle Continue megace has refills Follow up with me in 4 weeks will check CBC, and iron panel then   4. Iron deficiency anemia due to chronic blood loss Continue iron  Will check CBC and iron panel in 4 weeks   5. Dyspareunia in female Try astroglide with sex UA C&S sent Can try ashwagadha OTC  6. Vaginal dryness Try luvena for vaginal moisture and

## 2021-04-26 LAB — URINALYSIS, ROUTINE W REFLEX MICROSCOPIC
Bilirubin, UA: NEGATIVE
Glucose, UA: NEGATIVE
Nitrite, UA: NEGATIVE
RBC, UA: NEGATIVE
Specific Gravity, UA: >=1.03 — AB (ref 1.005–1.030)
Urobilinogen, Ur: 1 mg/dL (ref 0.2–1.0)
pH, UA: 5.5 (ref 5.0–7.5)

## 2021-04-26 LAB — MICROSCOPIC EXAMINATION: Casts: NONE SEEN /lpf

## 2021-04-28 LAB — CYTOLOGY - PAP
Chlamydia: NEGATIVE
Comment: NEGATIVE
Comment: NEGATIVE
Comment: NORMAL
Diagnosis: NEGATIVE
High risk HPV: NEGATIVE
Neisseria Gonorrhea: NEGATIVE

## 2021-04-29 ENCOUNTER — Telehealth: Payer: Self-pay | Admitting: Adult Health

## 2021-04-29 LAB — URINE CULTURE

## 2021-04-29 MED ORDER — SULFAMETHOXAZOLE-TRIMETHOPRIM 800-160 MG PO TABS: 1.0000 | ORAL_TABLET | Freq: Two times a day (BID) | ORAL | 0 refills | Status: DC

## 2021-04-29 NOTE — Telephone Encounter (Signed)
Pt aware urine + E COLI and rx sent in for septra ds. Push fluids

## 2021-05-23 DIAGNOSIS — E010 Iodine-deficiency related diffuse (endemic) goiter: Secondary | ICD-10-CM | POA: Diagnosis not present

## 2021-05-23 DIAGNOSIS — L659 Nonscarring hair loss, unspecified: Secondary | ICD-10-CM | POA: Diagnosis not present

## 2021-05-23 DIAGNOSIS — E559 Vitamin D deficiency, unspecified: Secondary | ICD-10-CM | POA: Diagnosis not present

## 2021-05-23 DIAGNOSIS — E042 Nontoxic multinodular goiter: Secondary | ICD-10-CM | POA: Diagnosis not present

## 2021-05-24 ENCOUNTER — Other Ambulatory Visit: Payer: Self-pay

## 2021-05-24 ENCOUNTER — Other Ambulatory Visit: Payer: Self-pay | Admitting: Nurse Practitioner

## 2021-05-24 ENCOUNTER — Encounter: Payer: Self-pay | Admitting: Adult Health

## 2021-05-24 ENCOUNTER — Ambulatory Visit: Payer: BC Managed Care – PPO | Admitting: Adult Health

## 2021-05-24 VITALS — BP 116/72 | HR 95 | Ht 69.0 in | Wt 163.5 lb

## 2021-05-24 DIAGNOSIS — N921 Excessive and frequent menstruation with irregular cycle: Secondary | ICD-10-CM | POA: Diagnosis not present

## 2021-05-24 DIAGNOSIS — N390 Urinary tract infection, site not specified: Secondary | ICD-10-CM | POA: Diagnosis not present

## 2021-05-24 DIAGNOSIS — B9620 Unspecified Escherichia coli [E. coli] as the cause of diseases classified elsewhere: Secondary | ICD-10-CM | POA: Diagnosis not present

## 2021-05-24 DIAGNOSIS — D500 Iron deficiency anemia secondary to blood loss (chronic): Secondary | ICD-10-CM

## 2021-05-24 DIAGNOSIS — K219 Gastro-esophageal reflux disease without esophagitis: Secondary | ICD-10-CM

## 2021-05-24 NOTE — Progress Notes (Signed)
°  Subjective:     Patient ID: Raven Smith, female   DOB: 06/20/1970, 51 y.o.   MRN: [id number]  HPI Raven Smith is a 51 year old black female, married, [id number] back in follow up on taking megace and is not bleeding, has some cramping. Lab Results  Component Value Date   DIAGPAP  04/25/2021    - Negative for intraepithelial lesion or malignancy (NILM)   HPV NOT Detected 12/25/2018   HPVHIGH Negative 04/25/2021   PCP is [place]  Review of Systems No bleeding now Some cramping  Feels better Reviewed past medical,surgical, social and family history. Reviewed medications and allergies.     Objective:   Physical Exam BP 116/72 (BP Location: Left Arm, Patient Position: Sitting, Cuff Size: Normal)    Pulse 95    Ht 5\' 9"  (1.753 m)    Wt 163 lb 8 oz (74.2 kg)    LMP  (LMP Unknown) Comment: takes Megace   BMI 24.14 kg/m     Skin warm and dry.  Lungs: clear to ausculation bilaterally. Cardiovascular: regular rate and rhythm.  Fall risk is low  Upstream - 05/24/21 1609       Pregnancy Intention Screening   Does the patient want to become pregnant in the next year? No    Does the patient's partner want to become pregnant in the next year? No    Would the patient like to discuss contraceptive options today? No      Contraception Wrap Up   Current Method Female Sterilization    End Method Female Sterilization    Contraception Counseling Provided No             Assessment:     1. E. coli urinary tract infection Will get culture for proof of cure  - Urine Culture  2. Menorrhagia with irregular cycle Continue megace,has refills   3. Iron deficiency anemia due to chronic blood loss Check labs  - CBC - Iron, TIBC and Ferritin Panel     Plan:     Will talk when labs back  Follow up prn

## 2021-05-25 LAB — CBC
Hematocrit: 37.3 % (ref 34.0–46.6)
Hemoglobin: 12.2 g/dL (ref 11.1–15.9)
MCH: 23.6 pg — ABNORMAL LOW (ref 26.6–33.0)
MCHC: 32.7 g/dL (ref 31.5–35.7)
MCV: 72 fL — ABNORMAL LOW (ref 79–97)
Platelets: 235 10*3/uL (ref 150–450)
RBC: 5.16 x10E6/uL (ref 3.77–5.28)
WBC: 9 10*3/uL (ref 3.4–10.8)

## 2021-05-25 LAB — IRON,TIBC AND FERRITIN PANEL
Ferritin: 210 ng/mL — ABNORMAL HIGH (ref 15–150)
Iron Saturation: 26 % (ref 15–55)
Iron: 75 ug/dL (ref 27–159)
Total Iron Binding Capacity: 287 ug/dL (ref 250–450)
UIBC: 212 ug/dL (ref 131–425)

## 2021-05-25 LAB — TSH: TSH: 1.22 u[IU]/mL (ref 0.450–4.500)

## 2021-05-26 ENCOUNTER — Telehealth: Payer: Self-pay | Admitting: Adult Health

## 2021-05-26 LAB — URINE CULTURE

## 2021-05-26 NOTE — Telephone Encounter (Signed)
Mailbox is full, if she calls, labs good,

## 2021-08-05 DIAGNOSIS — R197 Diarrhea, unspecified: Secondary | ICD-10-CM | POA: Diagnosis not present

## 2021-08-17 ENCOUNTER — Encounter (HOSPITAL_COMMUNITY): Payer: Self-pay | Admitting: Emergency Medicine

## 2021-08-17 ENCOUNTER — Emergency Department (HOSPITAL_COMMUNITY)
Admission: EM | Admit: 2021-08-17 | Discharge: 2021-08-17 | Disposition: A | Discharge status: Home / Self Care | Payer: BC Managed Care – PPO | Attending: Emergency Medicine | Admitting: Emergency Medicine

## 2021-08-17 ENCOUNTER — Other Ambulatory Visit: Payer: Self-pay

## 2021-08-17 DIAGNOSIS — R5383 Other fatigue: Secondary | ICD-10-CM | POA: Diagnosis not present

## 2021-08-17 DIAGNOSIS — R197 Diarrhea, unspecified: Secondary | ICD-10-CM | POA: Insufficient documentation

## 2021-08-17 LAB — BASIC METABOLIC PANEL
Anion gap: 8 (ref 5–15)
BUN: 9 mg/dL (ref 6–20)
CO2: 23 mmol/L (ref 22–32)
Calcium: 9.2 mg/dL (ref 8.9–10.3)
Chloride: 107 mmol/L (ref 98–111)
Creatinine, Ser: 0.92 mg/dL (ref 0.44–1.00)
GFR, Estimated: >60 mL/min (ref >60)
Glucose, Bld: 95 mg/dL (ref 70–99)
Potassium: 3.4 mmol/L — ABNORMAL LOW (ref 3.5–5.1)
Sodium: 138 mmol/L (ref 135–145)

## 2021-08-17 LAB — CBC
HCT: 38.7 % (ref 36.0–46.0)
Hemoglobin: 12.5 g/dL (ref 12.0–15.0)
MCH: 27.1 pg (ref 26.0–34.0)
MCHC: 32.3 g/dL (ref 30.0–36.0)
MCV: 83.8 fL (ref 80.0–100.0)
Platelets: 246 10*3/uL (ref 150–400)
RBC: 4.62 MIL/uL (ref 3.87–5.11)
RDW: 12.7 % (ref 11.5–15.5)
WBC: 8.1 10*3/uL (ref 4.0–10.5)
nRBC: 0 % (ref 0.0–0.2)

## 2021-08-17 MED ORDER — SODIUM CHLORIDE 0.9 % IV BOLUS (SEPSIS)
1000.0000 mL | Freq: Once | INTRAVENOUS | Status: AC
  Administered 2021-08-17: 1000 mL via INTRAVENOUS

## 2021-08-17 MED ORDER — POTASSIUM CHLORIDE CRYS ER 20 MEQ PO TBCR
40.0000 meq | EXTENDED_RELEASE_TABLET | Freq: Once | ORAL | Status: AC
  Administered 2021-08-17: 40 meq via ORAL
  Filled 2021-08-17: qty 2

## 2021-08-17 MED ORDER — SODIUM CHLORIDE 0.9 % IV SOLN: 1000.0000 mL | INTRAVENOUS | Status: DC

## 2021-08-17 NOTE — Discharge Instructions (Addendum)
You can take over-the-counter Imodium as needed to help with the diarrhea.  A stool study was sent off for analysis.  Follow-up with your primary care doctor to check on those results.  Make sure to stay well-hydrated.  Return for fever worsening symptoms ?

## 2021-08-17 NOTE — ED Triage Notes (Signed)
Pt c/o diarrhea for about 17 days and states she is getting weak.  ?

## 2021-08-17 NOTE — ED Provider Notes (Signed)
?[facility] EMERGENCY DEPARTMENT ?Provider Note ? ? ?CSN: [id number] ?Arrival date & time: 08/17/21  1954 ? ?  ? ?History ? ?Chief Complaint  ?Patient presents with  ? Fatigue  ? ? ?Raven Smith is a 51 y.o. female. ? ?HPI ? ?Patient has history of chronic back pain, irregular menstrual bleeding, hyperlipidemia and anemia.  She presents to the ED with complaints of diarrhea that started 17 days ago.  Patient states she felt like she may have had some bad food initially.  She started having frequent episodes of diarrhea.  She had a video visit with her doctor who thought it could have been a GI bug.  Patient states her symptoms have persisted.  She has several episodes of diarrhea daily.  stool is watery.  She is having some mild abdominal cramping but denies any severe pain.  She is not having any fevers.  No blood in her stool.  Patient is feeling fatigued. ? ?Home Medications ?Prior to Admission medications   ?Medication Sig Start Date End Date Taking? Authorizing Provider  ?acetaminophen (TYLENOL) 500 MG tablet Take 500 mg by mouth every 6 (six) hours as needed.   Yes [provider]  ?cyclobenzaprine (FLEXERIL) 10 MG tablet Take 10 mg by mouth 3 (three) times daily as needed for muscle spasms. 12/28/20  Yes [provider]  ?loratadine (CLARITIN) 10 MG tablet Take 10 mg by mouth daily as needed for allergies.   Yes [provider]  ?megestrol (MEGACE) 40 MG tablet Take 1 daily 03/31/21  Yes Cyril Mourning A, NP  ?pantoprazole (PROTONIX) 40 MG tablet TAKE 1 TABLET(40 MG) BY MOUTH DAILY ?Patient taking differently: Take 40 mg by mouth daily. 05/24/21  Yes Gelene Mink, NP  ?ferrous sulfate 325 (65 FE) MG tablet Take 1 tablet (325 mg total) by mouth daily. ?Patient not taking: Reported on 08/17/2021 11/02/20   Jacalyn Lefevre, MD  ?   ? ?Allergies    ?Patient has no known allergies.   ? ?Review of Systems   ?Review of Systems  ?All other systems reviewed and are negative. ? ?Physical  Exam ?Updated Vital Signs ?BP 109/81 (BP Location: Right Arm)   Pulse (!) 113   Temp 98.2 ?F (36.8 ?C) (Oral)   Resp 15   Ht 1.753 m (5\' 9" )   Wt 69.9 kg   SpO2 100%   BMI 22.74 kg/m?  ?Physical Exam ?Vitals and nursing note reviewed.  ?Constitutional:   ?   General: She is not in acute distress. ?   Appearance: She is well-developed.  ?HENT:  ?   Head: Normocephalic and atraumatic.  ?   Right Ear: External ear normal.  ?   Left Ear: External ear normal.  ?Eyes:  ?   General: No scleral icterus.    ?   Right eye: No discharge.     ?   Left eye: No discharge.  ?   Conjunctiva/sclera: Conjunctivae normal.  ?Neck:  ?   Trachea: No tracheal deviation.  ?Cardiovascular:  ?   Rate and Rhythm: Normal rate and regular rhythm.  ?Pulmonary:  ?   Effort: Pulmonary effort is normal. No respiratory distress.  ?   Breath sounds: Normal breath sounds. No stridor. No wheezing or rales.  ?Abdominal:  ?   General: Bowel sounds are normal. There is no distension.  ?   Palpations: Abdomen is soft.  ?   Tenderness: There is no abdominal tenderness. There is no guarding or rebound.  ?Musculoskeletal:     ?  General: No tenderness or deformity.  ?   Cervical back: Neck supple.  ?Skin: ?   General: Skin is warm and dry.  ?   Findings: No rash.  ?Neurological:  ?   General: No focal deficit present.  ?   Mental Status: She is alert.  ?   Cranial Nerves: No cranial nerve deficit (no facial droop, extraocular movements intact, no slurred speech).  ?   Sensory: No sensory deficit.  ?   Motor: No abnormal muscle tone or seizure activity.  ?   Coordination: Coordination normal.  ?Psychiatric:     ?   Mood and Affect: Mood normal.  ? ? ?ED Results / Procedures / Treatments   ?Labs ?(all labs ordered are listed, but only abnormal results are displayed) ?Labs Reviewed  ?BASIC METABOLIC PANEL - Abnormal; Notable for the following components:  ?    Result Value  ? Potassium 3.4 (*)   ? All other components within normal limits   ?GASTROINTESTINAL PANEL BY PCR, STOOL (REPLACES STOOL CULTURE)  ?CBC  ? ? ?EKG ?None ? ?Radiology ?No results found. ? ?Procedures ?Procedures  ? ? ?Medications Ordered in ED ?Medications  ?sodium chloride 0.9 % bolus 1,000 mL (1,000 mLs Intravenous New Bag/Given 08/17/21 2057)  ?  Followed by  ?0.9 %  sodium chloride infusion (has no administration in time range)  ?potassium chloride SA (KLOR-CON M) CR tablet 40 mEq (has no administration in time range)  ? ? ?ED Course/ Medical Decision Making/ A&P ?Clinical Course as of 08/17/21 2240  ?Wed Aug 17, 2021  ?2129 CBC [JK]  ?2129 Normal [JK]  ?2129 Basic metabolic panel(!) ?Normal except slight decrease in potassium [JK]  ?  ?Clinical Course User Index ?[JK] Linwood Dibbles, MD  ? ?                        ?Medical Decision Making ?Amount and/or Complexity of Data Reviewed ?Labs: ordered. Decision-making details documented in ED Course. ? ?Risk ?Prescription drug management. ? ? ?Patient presented to ED for evaluation of diarrhea.  Abdominal exam benign.  No significant tenderness.  Doubt diverticulitis peritonitis colitis.  Laboratory tests are reassuring.  No leukocytosis.  Metabolic panel just shows mild hypokalemia 3.4.  Patient was given a dose of oral potassium.  She was also treated with IV fluids.  No signs of severe dehydration or indication for admission to the hospital.  Patient states she did try Imodium but it made her feel as if she was going to become constipated she did not continue that.  I did encourage her to try that medication if her diarrhea persists.  Stool study was ordered although patient was not initially unable to give a sample.  Will recommend outpatient follow-up with PCP. ? ? ? ? ? ? ? ?Final Clinical Impression(s) / ED Diagnoses ?Final diagnoses:  ?Diarrhea, unspecified type  ? ? ?Rx / DC Orders ?ED Discharge Orders   ? ? None  ? ?  ? ? ?  ?Linwood Dibbles, MD ?08/17/21 2240 ? ?

## 2021-08-17 NOTE — ED Notes (Signed)
Pt attempted but not able to provide stool sample at this time.

## 2021-08-19 LAB — GASTROINTESTINAL PANEL BY PCR, STOOL (REPLACES STOOL CULTURE)

## 2021-08-25 DIAGNOSIS — K029 Dental caries, unspecified: Secondary | ICD-10-CM | POA: Diagnosis not present

## 2021-08-25 DIAGNOSIS — Z6824 Body mass index (BMI) 24.0-24.9, adult: Secondary | ICD-10-CM | POA: Diagnosis not present

## 2021-08-25 DIAGNOSIS — R197 Diarrhea, unspecified: Secondary | ICD-10-CM | POA: Diagnosis not present

## 2021-08-25 DIAGNOSIS — J329 Chronic sinusitis, unspecified: Secondary | ICD-10-CM | POA: Diagnosis not present

## 2021-09-02 ENCOUNTER — Other Ambulatory Visit: Payer: Self-pay | Admitting: Adult Health

## 2021-11-01 ENCOUNTER — Other Ambulatory Visit: Payer: Self-pay | Admitting: Internal Medicine

## 2021-11-01 ENCOUNTER — Other Ambulatory Visit (HOSPITAL_COMMUNITY): Payer: Self-pay | Admitting: Internal Medicine

## 2021-11-01 DIAGNOSIS — E042 Nontoxic multinodular goiter: Secondary | ICD-10-CM

## 2021-11-28 ENCOUNTER — Ambulatory Visit (HOSPITAL_COMMUNITY)
Admission: RE | Admit: 2021-11-28 | Discharge: 2021-11-28 | Disposition: A | Discharge status: Home / Self Care | Payer: BC Managed Care – PPO | Source: Ambulatory Visit | Attending: Internal Medicine | Admitting: Internal Medicine

## 2021-11-28 DIAGNOSIS — E042 Nontoxic multinodular goiter: Secondary | ICD-10-CM | POA: Diagnosis not present

## 2021-11-28 DIAGNOSIS — E041 Nontoxic single thyroid nodule: Secondary | ICD-10-CM | POA: Diagnosis not present

## 2021-11-30 DIAGNOSIS — E042 Nontoxic multinodular goiter: Secondary | ICD-10-CM | POA: Diagnosis not present

## 2021-11-30 DIAGNOSIS — E559 Vitamin D deficiency, unspecified: Secondary | ICD-10-CM | POA: Diagnosis not present

## 2021-12-14 ENCOUNTER — Telehealth: Payer: Self-pay | Admitting: Adult Health

## 2021-12-14 ENCOUNTER — Other Ambulatory Visit: Payer: Self-pay | Admitting: *Deleted

## 2021-12-14 MED ORDER — MEGESTROL ACETATE 40 MG PO TABS: ORAL_TABLET | ORAL | 3 refills | Status: DC

## 2021-12-14 NOTE — Telephone Encounter (Signed)
Patient wanted to see if she needed a f/u visit. She is going to get pharmacy to send in for refill request for megace. Please advise.

## 2021-12-14 NOTE — Telephone Encounter (Signed)
Pt informed refill has been sent.

## 2021-12-14 NOTE — Telephone Encounter (Signed)
Refill request sent to [facility]. Will notify patient when it has been sent.

## 2022-04-21 ENCOUNTER — Other Ambulatory Visit: Payer: Self-pay | Admitting: Adult Health

## 2022-04-28 DIAGNOSIS — J329 Chronic sinusitis, unspecified: Secondary | ICD-10-CM | POA: Diagnosis not present

## 2022-04-28 DIAGNOSIS — Z6824 Body mass index (BMI) 24.0-24.9, adult: Secondary | ICD-10-CM | POA: Diagnosis not present

## 2022-05-17 ENCOUNTER — Encounter (HOSPITAL_COMMUNITY): Payer: Self-pay

## 2022-05-17 ENCOUNTER — Emergency Department (HOSPITAL_COMMUNITY): Payer: Self-pay

## 2022-05-17 ENCOUNTER — Emergency Department (HOSPITAL_COMMUNITY)
Admission: EM | Admit: 2022-05-17 | Discharge: 2022-05-17 | Disposition: A | Discharge status: Home / Self Care | Payer: Self-pay | Attending: Emergency Medicine | Admitting: Emergency Medicine

## 2022-05-17 ENCOUNTER — Other Ambulatory Visit: Payer: Self-pay

## 2022-05-17 DIAGNOSIS — R0789 Other chest pain: Secondary | ICD-10-CM

## 2022-05-17 DIAGNOSIS — Z20822 Contact with and (suspected) exposure to covid-19: Secondary | ICD-10-CM | POA: Insufficient documentation

## 2022-05-17 DIAGNOSIS — J069 Acute upper respiratory infection, unspecified: Secondary | ICD-10-CM | POA: Insufficient documentation

## 2022-05-17 LAB — COMPREHENSIVE METABOLIC PANEL WITH GFR
ALT: 131 U/L — ABNORMAL HIGH (ref 0–44)
AST: 30 U/L (ref 15–41)
Albumin: 3.9 g/dL (ref 3.5–5.0)
Alkaline Phosphatase: 77 U/L (ref 38–126)
Anion gap: 10 (ref 5–15)
BUN: 6 mg/dL (ref 6–20)
CO2: 22 mmol/L (ref 22–32)
Calcium: 8.9 mg/dL (ref 8.9–10.3)
Chloride: 105 mmol/L (ref 98–111)
Creatinine, Ser: 0.66 mg/dL (ref 0.44–1.00)
GFR, Estimated: >60 mL/min (ref >60)
Glucose, Bld: 82 mg/dL (ref 70–99)
Potassium: 3.5 mmol/L (ref 3.5–5.1)
Sodium: 137 mmol/L (ref 135–145)
Total Bilirubin: 0.7 mg/dL (ref 0.3–1.2)
Total Protein: 7.3 g/dL (ref 6.5–8.1)

## 2022-05-17 LAB — CBC
HCT: 41.1 % (ref 36.0–46.0)
Hemoglobin: 13.3 g/dL (ref 12.0–15.0)
MCH: 27.6 pg (ref 26.0–34.0)
MCHC: 32.4 g/dL (ref 30.0–36.0)
MCV: 85.3 fL (ref 80.0–100.0)
Platelets: 225 10*3/uL (ref 150–400)
RBC: 4.82 MIL/uL (ref 3.87–5.11)
RDW: 13.9 % (ref 11.5–15.5)
WBC: 8.1 10*3/uL (ref 4.0–10.5)
nRBC: 0 % (ref 0.0–0.2)

## 2022-05-17 LAB — TROPONIN I (HIGH SENSITIVITY)
Troponin I (High Sensitivity): <2 ng/L (ref <18)
Troponin I (High Sensitivity): <2 ng/L (ref <18)

## 2022-05-17 LAB — RESP PANEL BY RT-PCR (RSV, FLU A&B, COVID)  RVPGX2
Influenza A by PCR: NEGATIVE
Influenza B by PCR: NEGATIVE
Resp Syncytial Virus by PCR: NEGATIVE
SARS Coronavirus 2 by RT PCR: NEGATIVE

## 2022-05-17 MED ORDER — OXYMETAZOLINE HCL 0.05 % NA SOLN
1.0000 | Freq: Once | NASAL | Status: AC
  Administered 2022-05-17: 1 via NASAL
  Filled 2022-05-17: qty 30

## 2022-05-17 MED ORDER — MECLIZINE HCL 12.5 MG PO TABS
25.0000 mg | ORAL_TABLET | Freq: Once | ORAL | Status: AC
  Administered 2022-05-17: 25 mg via ORAL
  Filled 2022-05-17: qty 2

## 2022-05-17 MED ORDER — FLUTICASONE PROPIONATE 50 MCG/ACT NA SUSP: 1.0000 | Freq: Every day | NASAL | 2 refills | Status: DC

## 2022-05-17 MED ORDER — PREDNISONE 50 MG PO TABS
50.0000 mg | ORAL_TABLET | Freq: Every day | ORAL | 0 refills | Status: DC
Start: 2022-05-17 — End: 2023-05-07

## 2022-05-17 MED ORDER — ALBUTEROL SULFATE HFA 108 (90 BASE) MCG/ACT IN AERS
1.0000 | INHALATION_SPRAY | RESPIRATORY_TRACT | Status: DC | PRN
  Filled 2022-05-17: qty 6.7

## 2022-05-17 MED ORDER — IOHEXOL 350 MG/ML SOLN
100.0000 mL | Freq: Once | INTRAVENOUS | Status: AC | PRN
  Administered 2022-05-17: 80 mL via INTRAVENOUS

## 2022-05-17 MED ORDER — AEROCHAMBER Z-STAT PLUS/MEDIUM MISC
1.0000 | Freq: Once | Status: AC
  Administered 2022-05-17: 1
  Filled 2022-05-17: qty 1

## 2022-05-17 NOTE — ED Provider Notes (Signed)
[facility] EMERGENCY DEPARTMENT Provider Note   CSN: [id number] Arrival date & time: 05/17/22  0841     History  Chief Complaint  Patient presents with   Chest Pain    Raven Smith is a 52 y.o. female.  Pt is a 52 yo female with a pmhx significant arthritis and amenia.  She is here today for CP and sob.  Pt said she was diagnosed with a  URI 2 weeks ago.  She was treated with amox and prednisone.  URI sx have improved, but she still feels dizzy and feels sob with ambulation.  Pt went to PCP this am and was sent here for further eval.  No fevers.       Home Medications Prior to Admission medications   Medication Sig Start Date End Date Taking? Authorizing Provider  acetaminophen (TYLENOL) 500 MG tablet Take 500 mg by mouth every 6 (six) hours as needed.   Yes [provider]  fluticasone (FLONASE) 50 MCG/ACT nasal spray Place 1 spray into both nostrils daily. 05/17/22  Yes Isla Pence, MD  loratadine (CLARITIN) 10 MG tablet Take 10 mg by mouth daily as needed for allergies.   Yes [provider]  megestrol (MEGACE) 40 MG tablet TAKE 1 TABLET BY MOUTH DAILY 04/21/22  Yes Derrek Monaco A, NP  predniSONE (DELTASONE) 50 MG tablet Take 1 tablet (50 mg total) by mouth daily with breakfast. 05/17/22  Yes Isla Pence, MD  amoxicillin (AMOXIL) 875 MG tablet Take 875 mg by mouth 2 (two) times daily. Patient not taking: Reported on 05/17/2022 04/28/22   [provider]  azithromycin (ZITHROMAX) 250 MG tablet Take 250 mg by mouth. Patient not taking: Reported on 05/17/2022 04/27/22   [provider]  benzonatate (TESSALON) 200 MG capsule Take 200 mg by mouth 3 (three) times daily. Patient not taking: Reported on 05/17/2022 04/28/22   [provider]  cyclobenzaprine (FLEXERIL) 10 MG tablet Take 10 mg by mouth 3 (three) times daily as needed for muscle spasms. Patient not taking: Reported on 05/17/2022 12/28/20   [provider]  ferrous  sulfate 325 (65 FE) MG tablet Take 1 tablet (325 mg total) by mouth daily. Patient not taking: Reported on 08/17/2021 11/02/20   Isla Pence, MD  guaiFENesin-codeine 100-10 MG/5ML syrup Take 10 mLs by mouth every 6 (six) hours as needed for cough. Patient not taking: Reported on 05/17/2022 04/28/22   [provider]  pantoprazole (PROTONIX) 40 MG tablet TAKE 1 TABLET(40 MG) BY MOUTH DAILY Patient not taking: Reported on 05/17/2022 05/24/21   Annitta Needs, NP      Allergies    Patient has no known allergies.    Review of Systems   Review of Systems  Respiratory:  Positive for shortness of breath.   Neurological:  Positive for dizziness.  All other systems reviewed and are negative.   Physical Exam Updated Vital Signs BP 113/82   Pulse 80   Temp 98.5 F (36.9 C) (Oral)   Resp 18   Ht 5\' 9"  (1.753 m)   Wt 72.6 kg   LMP  (Within Months)   SpO2 100%   BMI 23.63 kg/m  Physical Exam Vitals and nursing note reviewed.  Constitutional:      Appearance: She is well-developed.  HENT:     Head: Normocephalic and atraumatic.     Comments: Clear fluid behind both ears Eyes:     Extraocular Movements: Extraocular movements intact.     Pupils: Pupils  are equal, round, and reactive to light.  Cardiovascular:     Rate and Rhythm: Normal rate and regular rhythm.     Heart sounds: Normal heart sounds.  Pulmonary:     Effort: Pulmonary effort is normal.     Breath sounds: Normal breath sounds.  Abdominal:     General: Bowel sounds are normal.     Palpations: Abdomen is soft.  Musculoskeletal:        General: Normal range of motion.     Cervical back: Normal range of motion and neck supple.  Skin:    General: Skin is warm.     Capillary Refill: Capillary refill takes less than 2 seconds.  Neurological:     General: No focal deficit present.     Mental Status: She is alert and oriented to person, place, and time.  Psychiatric:        Mood and Affect: Mood normal.         Behavior: Behavior normal.     ED Results / Procedures / Treatments   Labs (all labs ordered are listed, but only abnormal results are displayed) Labs Reviewed  COMPREHENSIVE METABOLIC PANEL - Abnormal; Notable for the following components:      Result Value   ALT 131 (*)    All other components within normal limits  RESP PANEL BY RT-PCR (RSV, FLU A&B, COVID)  RVPGX2  CBC  TROPONIN I (HIGH SENSITIVITY)  TROPONIN I (HIGH SENSITIVITY)    EKG EKG Interpretation  Date/Time:  Wednesday May 17 2022 08:56:21 EST Ventricular Rate:  94 PR Interval:  136 QRS Duration: 81 QT Interval:  331 QTC Calculation: 414 R Axis:   32 Text Interpretation: Sinus rhythm Low voltage, precordial leads Probable anteroseptal infarct, old No significant change since last tracing Confirmed by Jacalyn Lefevre [phone]) on 05/17/2022 10:56:36 AM  Radiology CT Angio Chest Pulmonary Embolism (PE) W or WO Contrast  Result Date: 05/17/2022 CLINICAL DATA:  Pain. High clinical suspicion for pulmonary embolism. EXAM: CT ANGIOGRAPHY CHEST WITH CONTRAST TECHNIQUE: Multidetector CT imaging of the chest was performed using the standard protocol during bolus administration of intravenous contrast. Multiplanar CT image reconstructions and MIPs were obtained to evaluate the vascular anatomy. RADIATION DOSE REDUCTION: This exam was performed according to the departmental dose-optimization program which includes automated exposure control, adjustment of the mA and/or kV according to patient size and/or use of iterative reconstruction technique. CONTRAST:  47mL OMNIPAQUE IOHEXOL 350 MG/ML SOLN COMPARISON:  Chest x-ray 11/02/2020 and older FINDINGS: Cardiovascular: The thoracic aorta has overall a normal course and caliber. The heart is not enlarged. No pericardial effusion. No segmental or larger pulmonary embolism identified. Mediastinum/Nodes: No abnormal lymph node enlargement seen in the axillary regions, hilum and mediastinum.  Normal caliber thoracic esophagus. Lungs/Pleura: There is linear opacity lung bases likely scar or atelectasis. No consolidation or pneumothorax. Trace pleural fluid. Upper Abdomen: Adrenal glands in the upper abdomen are grossly preserved but incompletely included in the imaging field. Previous cholecystectomy. Musculoskeletal: Mild degenerative changes along the spine. Review of the MIP images confirms the above findings. IMPRESSION: No pulmonary embolism identified. Electronically Signed   By: Karen Kays M.D.   On: 05/17/2022 11:29    Procedures Procedures    Medications Ordered in ED Medications  albuterol (VENTOLIN HFA) 108 (90 Base) MCG/ACT inhaler 1-2 puff (has no administration in time range)  aerochamber Z-Stat Plus/medium 1 each (has no administration in time range)  meclizine (ANTIVERT) tablet 25 mg (25 mg Oral Given  05/17/22 0929)  oxymetazoline (AFRIN) 0.05 % nasal spray 1 spray (1 spray Each Nare Given 05/17/22 0929)  iohexol (OMNIPAQUE) 350 MG/ML injection 100 mL (80 mLs Intravenous Contrast Given 05/17/22 1113)    ED Course/ Medical Decision Making/ A&P                           Medical Decision Making Amount and/or Complexity of Data Reviewed Labs: ordered. Radiology: ordered.  Risk OTC drugs. Prescription drug management.   This patient presents to the ED for concern of cp, this involves an extensive number of treatment options, and is a complaint that carries with it a high risk of complications and morbidity.  The differential diagnosis includes pna, PE, covid/flu, electrolyte abn   Co morbidities that complicate the patient evaluation  Arthritis and amenia   Additional history obtained:  Additional history obtained from epic chart review Lab Tests:  I Ordered, and personally interpreted labs.  The pertinent results include:  cbc nl, cmp nl, trop nl; covid/flu/rsv neg   Imaging Studies ordered:  I ordered imaging studies including cta  I independently  visualized and interpreted imaging which showed  IMPRESSION:  No pulmonary embolism identified.   I agree with the radiologist interpretation   Cardiac Monitoring:  The patient was maintained on a cardiac monitor.  I personally viewed and interpreted the cardiac monitored which showed an underlying rhythm of: nsr   Medicines ordered and prescription drug management:  I ordered medication including antivert and afrin  for sx  Reevaluation of the patient after these medicines showed that the patient improved I have reviewed the patients home medicines and have made adjustments as needed   Test Considered:  ct   Critical Interventions:  ct   Problem List / ED Course:  Sob:  likely post viral syn.  Cardiac eval neg.  CT neg for PE or pna. Pt's sx treated with inhaler/prednisone/afrin and flonase.  Pt is to return if worse.  F/u with pcp.   Reevaluation:  After the interventions noted above, I reevaluated the patient and found that they have :improved   Social Determinants of Health:  Lives at home   Dispostion:  After consideration of the diagnostic results and the patients response to treatment, I feel that the patent would benefit from discharge with outpatient f/u.          Final Clinical Impression(s) / ED Diagnoses Final diagnoses:  Atypical chest pain  Viral upper respiratory tract infection    Rx / DC Orders ED Discharge Orders          Ordered    predniSONE (DELTASONE) 50 MG tablet  Daily with breakfast        05/17/22 1237    fluticasone (FLONASE) 50 MCG/ACT nasal spray  Daily        05/17/22 1237              Isla Pence, MD 05/17/22 1239

## 2022-05-17 NOTE — ED Triage Notes (Signed)
Pt presents to ED, sent by Dr Hilma Favors for abnormal EKG. Pt states she has had a respiratory infection x 2 weeks with chest pressure on and off, pt states she went to PCP this am and had EKG done, sent to ED for evaluation

## 2022-07-02 IMAGING — MR MR LUMBAR SPINE W/O CM
5 series · 31 of 48 positions shown · non-contrast
Comparison: Radiography 07/10/2014

CLINICAL DATA: Low back pain over the last year. Difficulty
standing, walking and sitting.

EXAM:
MRI LUMBAR SPINE WITHOUT CONTRAST
TECHNIQUE: Multiplanar, multisequence MR imaging of the lumbar spine was
performed. No intravenous contrast was administered.

[Series 5: T2 · sagittal · 4.0mm · 0.73mm/px · 5 of 13 slices shown (1 of 2)]
[im 1/13]
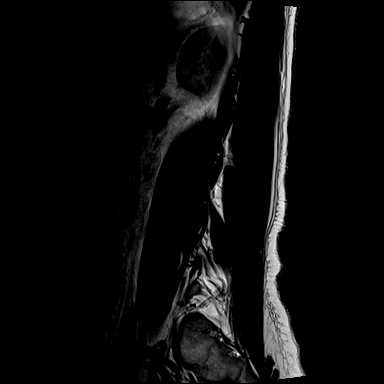
[im 4/13]
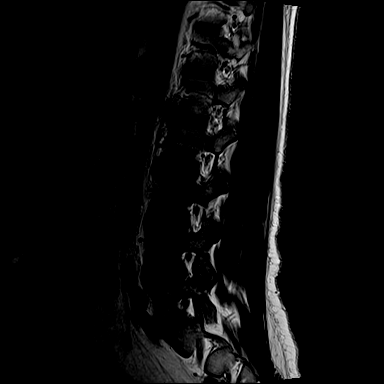
[im 7/13]
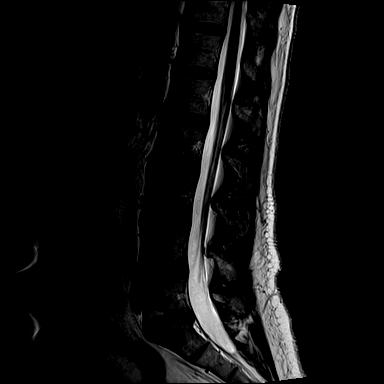
[im 10/13]
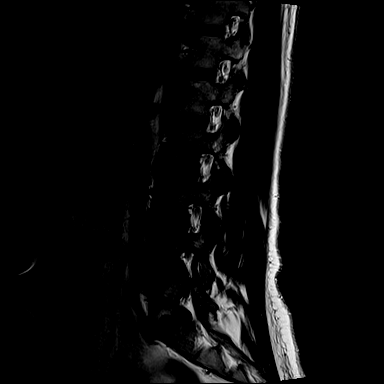
[im 13/13]
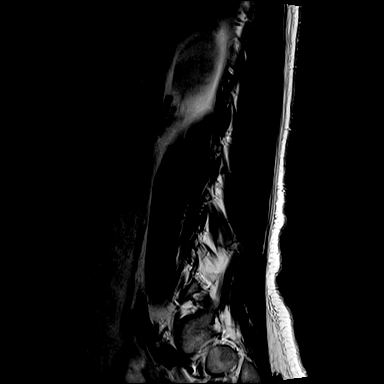

[Series 6: T1 · sagittal · 4.0mm · 0.81mm/px · 6 of 13 slices shown (1 of 2)]
[im 1/13]
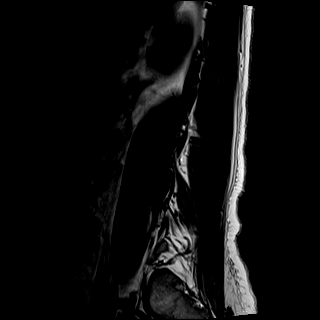
[im 3/13]
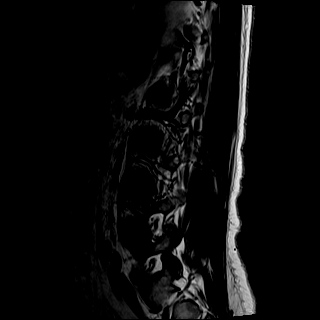
[im 5/13]
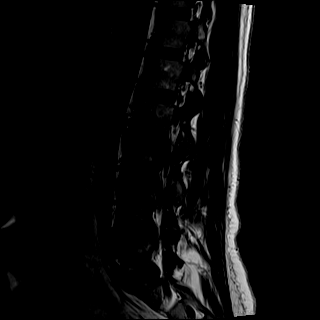
[im 8/13]
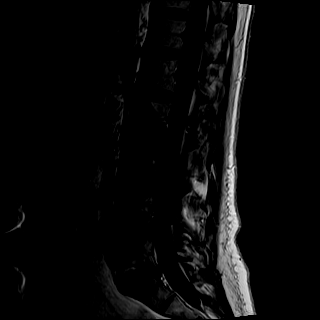
[im 10/13]
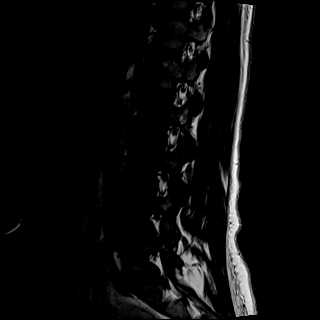
[im 13/13]
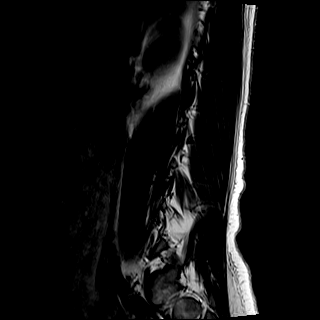

[Series 7: STIR · sagittal · 4.0mm · 0.51mm/px · 2 of 15 slices shown]
[im 1/15]
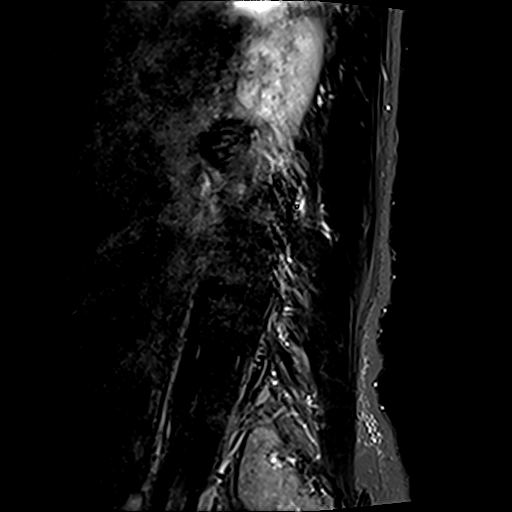
[im 3/15]
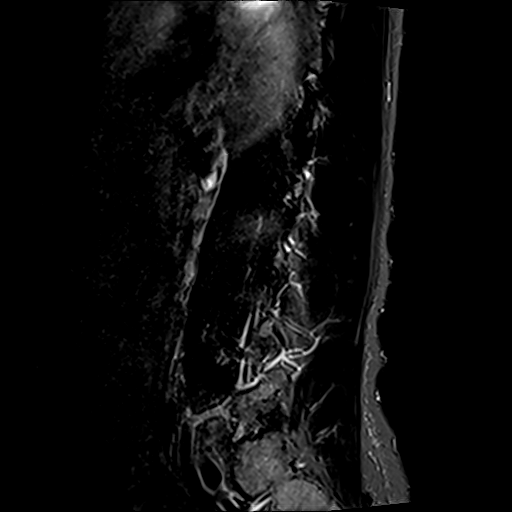

[Series 8: T2 · axial · 4.0mm · 0.70mm/px · z∈[-115,+123]mm · 9 of 33 slices shown (2 of 2)]
[im 1/33]
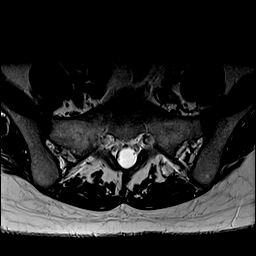
[im 5/33]
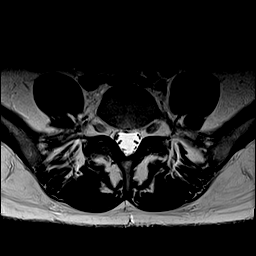
[im 10/33]
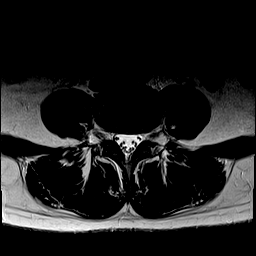
[im 14/33]
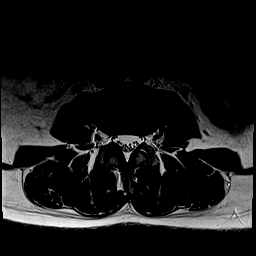
[im 17/33]
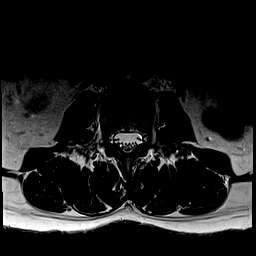
[im 19/33]
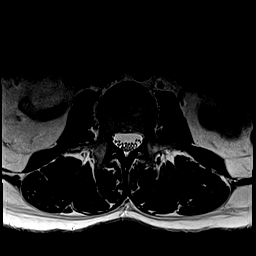
[im 23/33]
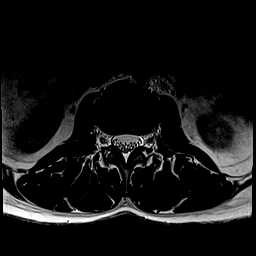
[im 28/33]
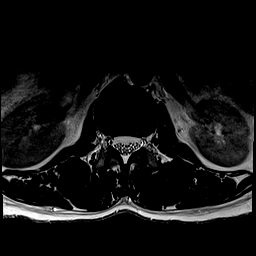
[im 33/33]
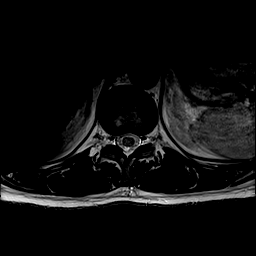

[Series 9: T1 · axial · 4.0mm · 0.35mm/px · z∈[-115,+123]mm · 9 of 33 slices shown (2 of 2)]
[im 1/33]
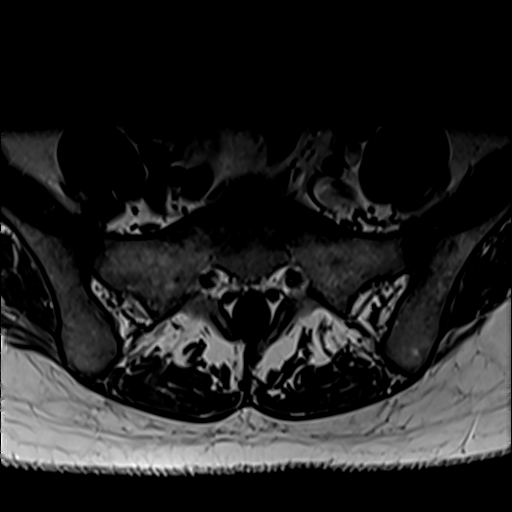
[im 5/33]
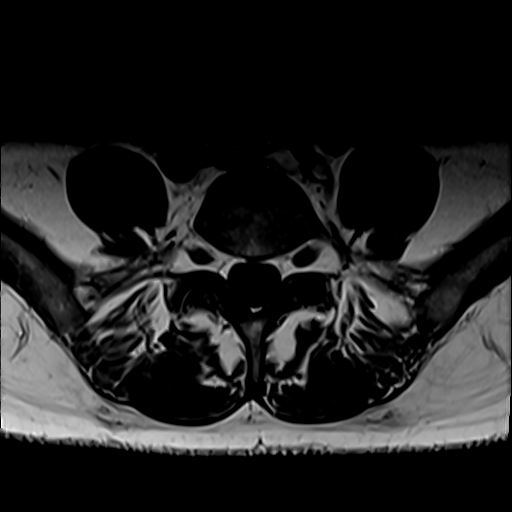
[im 10/33]
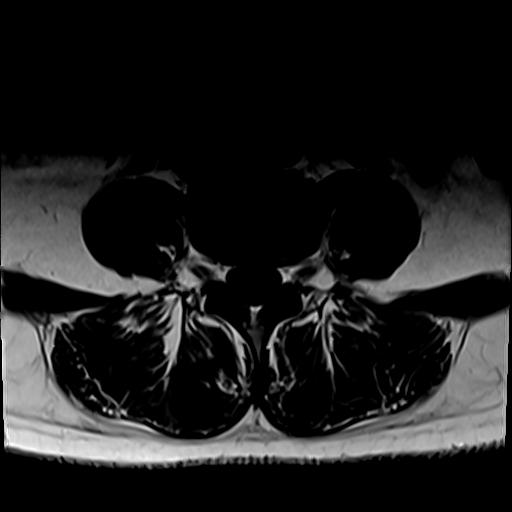
[im 14/33]
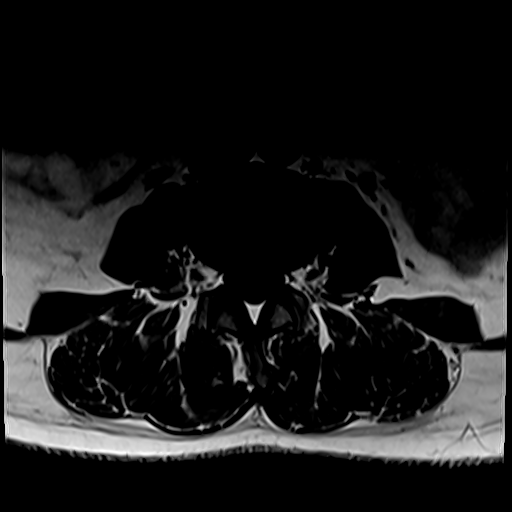
[im 17/33]
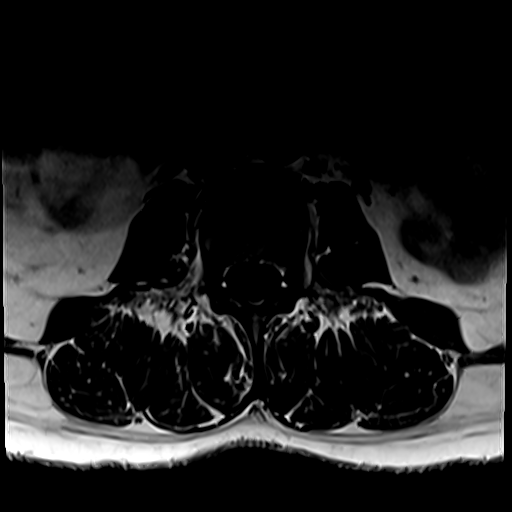
[im 19/33]
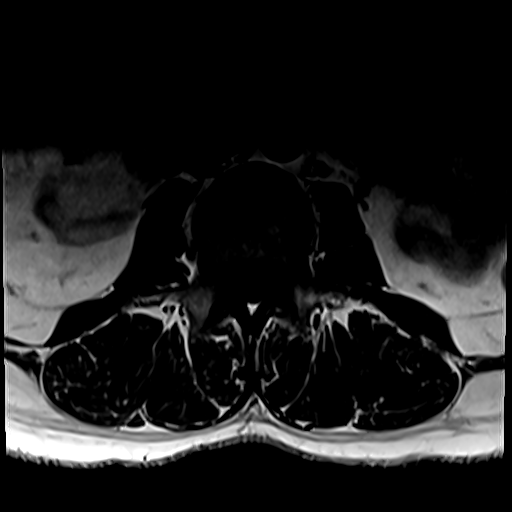
[im 23/33]
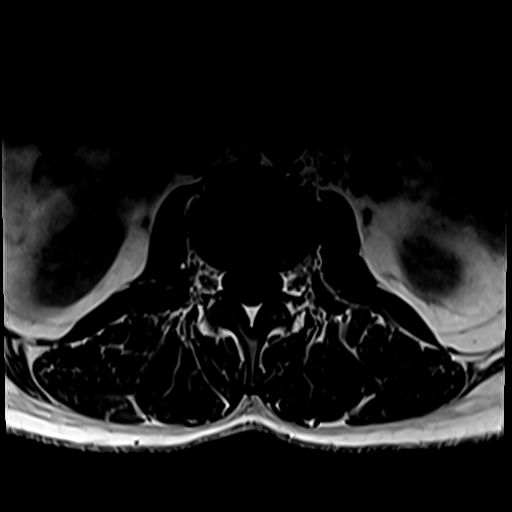
[im 28/33]
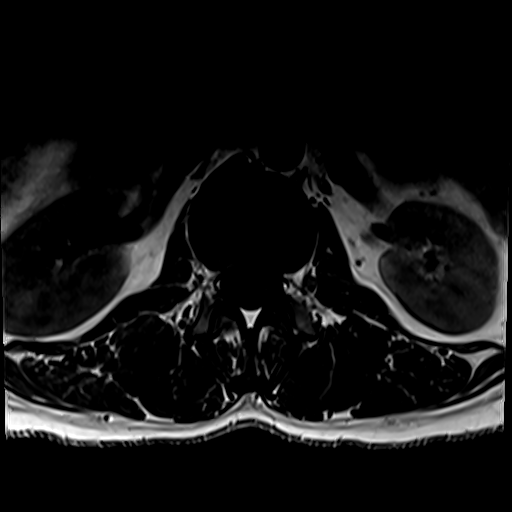
[im 33/33]
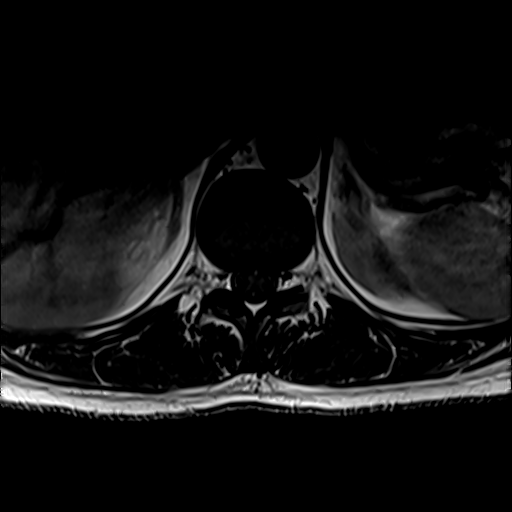

[31 of 48 positions shown; findings below may reference images not displayed]

FINDINGS: Segmentation:  5 lumbar type vertebral bodies.

Alignment:  Normal

Vertebrae: No fracture or focal bone lesion. Discogenic endplate
marrow changes at L5-S1 without active edema.

Conus medullaris and cauda equina: Conus extends to the L1 level.
Conus and cauda equina appear normal.

Paraspinal and other soft tissues: Negative

Disc levels:

No significant finding at L3-4 or above. No disc degeneration or
herniation. No stenosis.

L4-5: Minimal disc bulge. Minimal facet osteoarthritis. No
compressive stenosis. Findings could relate to low back pain.

L5-S1: Chronic disc degeneration with a small central protrusion
with slight caudal down turning. This does not have any compressive
effect upon the thecal sac or S1 nerves but could relate to low back
pain. Minimal facet osteoarthritis also present at this level.
IMPRESSION: L4-5: Minimal disc bulge. Mild facet osteoarthritis. No stenosis.
Findings could relate to back pain.

L5-S1: Disc degeneration with a shallow central protrusion and
slight caudal down turning. No compressive effect upon the thecal
sac or S1 nerves. Mild facet osteoarthritis at this level. These
findings could also relate to low back pain.

## 2022-09-09 ENCOUNTER — Other Ambulatory Visit: Payer: Self-pay | Admitting: Adult Health

## 2022-12-06 ENCOUNTER — Other Ambulatory Visit: Payer: Self-pay | Admitting: Nurse Practitioner

## 2022-12-06 DIAGNOSIS — E042 Nontoxic multinodular goiter: Secondary | ICD-10-CM

## 2022-12-15 ENCOUNTER — Ambulatory Visit
Admission: RE | Admit: 2022-12-15 | Discharge: 2022-12-15 | Disposition: A | Discharge status: Home / Self Care | Payer: Commercial Managed Care - PPO | Source: Ambulatory Visit | Attending: Nurse Practitioner | Admitting: Nurse Practitioner

## 2022-12-15 ENCOUNTER — Other Ambulatory Visit: Payer: Commercial Managed Care - PPO

## 2022-12-15 DIAGNOSIS — E042 Nontoxic multinodular goiter: Secondary | ICD-10-CM

## 2023-01-16 ENCOUNTER — Other Ambulatory Visit: Payer: Self-pay | Admitting: Adult Health

## 2023-03-30 ENCOUNTER — Other Ambulatory Visit (HOSPITAL_COMMUNITY): Payer: Self-pay | Admitting: Family Medicine

## 2023-03-30 DIAGNOSIS — M51362 Other intervertebral disc degeneration, lumbar region with discogenic back pain and lower extremity pain: Secondary | ICD-10-CM

## 2023-04-09 ENCOUNTER — Ambulatory Visit (HOSPITAL_COMMUNITY)
Admission: RE | Admit: 2023-04-09 | Discharge: 2023-04-09 | Disposition: A | Discharge status: Home / Self Care | Payer: Commercial Managed Care - PPO | Source: Ambulatory Visit | Attending: Family Medicine | Admitting: Family Medicine

## 2023-04-09 DIAGNOSIS — M51362 Other intervertebral disc degeneration, lumbar region with discogenic back pain and lower extremity pain: Secondary | ICD-10-CM | POA: Insufficient documentation

## 2023-05-07 ENCOUNTER — Ambulatory Visit
Admission: EM | Admit: 2023-05-07 | Discharge: 2023-05-07 | Disposition: A | Discharge status: Home / Self Care | Payer: Commercial Managed Care - PPO | Attending: Nurse Practitioner | Admitting: Nurse Practitioner

## 2023-05-07 DIAGNOSIS — R300 Dysuria: Secondary | ICD-10-CM | POA: Insufficient documentation

## 2023-05-07 DIAGNOSIS — J0100 Acute maxillary sinusitis, unspecified: Secondary | ICD-10-CM | POA: Insufficient documentation

## 2023-05-07 LAB — POCT URINALYSIS DIP (MANUAL ENTRY)
Bilirubin, UA: NEGATIVE
Glucose, UA: NEGATIVE mg/dL
Ketones, POC UA: NEGATIVE mg/dL
Nitrite, UA: NEGATIVE
Protein Ur, POC: 30 mg/dL — AB
Spec Grav, UA: >=1.03 — AB
Urobilinogen, UA: 1 U/dL
pH, UA: 7

## 2023-05-07 MED ORDER — AMOXICILLIN-POT CLAVULANATE 875-125 MG PO TABS: 1.0000 | ORAL_TABLET | Freq: Two times a day (BID) | ORAL | 0 refills | Status: AC

## 2023-05-07 NOTE — Discharge Instructions (Addendum)
Take Augmentin as prescribed to treat sinus infection.  Stop Sudafed.  Continue Mucinex 600 mg twice daily.   Please increase water intake.  We will contact you if the urine culture shows an infection.

## 2023-05-07 NOTE — ED Triage Notes (Addendum)
Pt reports she has a hoarse voice, sore throat, and head/ eye pressure, dizziness, chest discomfort , and right ear fullness x 2 weeks.    Took sudafed and mucinex  Pt also states she has some burning with urination.

## 2023-05-07 NOTE — ED Provider Notes (Signed)
[facility]    CSN: [id number] Arrival date & time: 05/07/23  1700      History   Chief Complaint No chief complaint on file.   HPI Raven Smith is a 52 y.o. female.      UPPER RESPIRATORY TRACT INFECTION Onset: 2 weeks COVID-19 testing history: COVID-19 vaccination status: Fever: no Body aches: no Chills: no Congested cough: 4-5 times daily Dry cough: {Blank single:19197::"yes","no"} Shortness of breath: yes Wheezing: {Blank single:19197::"yes","no"} Chest pain: blinking pain Chest tightness: {Blank single:19197::"yes","no"} Chest congestion: yes Nasal congestion: no Runny nose: {Blank single:19197::"yes","no"} Post nasal drip: {Blank single:19197::"yes","no"} Sore throat: yes Sinus pressure: yes; no signficant drainage Headache: yes Ear pain: right ear plugged Hoarse Dizzy with sinus infection Abdominal pain: no Nausea: no  Vomiting: no Diarrhea: no  Decreased appetite: decreased  Loss of taste/smell: {Blank single:19197::"yes","no"}  Rash: {Blank single:19197::"yes","no"} Fatigue: yes Sick contacts: {Blank single:19197::"yes","no"} Treatments attempted: tea, lemon, honey, ginger, Sudafed, Mucinex Relief with OTC medications:  URINARY SYMPTOMS Duration: 2 weeks Dysuria: yes Urinary frequency: yes Urgency: sometimes Small volume voids: {Blank single:19197::"yes","no"} Symptom severity: {Blank single:19197::"yes","no"} Urinary incontinence: {Blank single:19197::"yes","no"} Foul odor: no Hematuria: yes Abdominal pain: {Blank single:19197::"yes","no"} Back pain: {Blank single:19197::"yes","no"} Suprapubic pain/pressure: {Blank single:19197::"yes","no"} Flank pain: {Blank single:19197::"yes","no"} Fever:  {Blank multiple:19196::"yes", "no", "subjective", "low grade"} Vomiting: no Nausea: yes Relief with cranberry juice: {Blank single:19197::"yes","no"} Relief with pyridium: {Blank single:19197::"yes","no"} Status:  better/worse/stable Previous urinary tract infection: {Blank single:19197::"yes","no"} Recurrent urinary tract infection: {Blank single:19197::"yes","no"} Sexual activity: No sexually active/monogomous/practicing safe sex History of sexually transmitted disease: {Blank single:19197::"yes","no"} Vaginal discharge: no Treatments attempted: {Blank multiple:19196::"none", "antibiotics", "pyridium", "cranberry", "increasing fluids"}     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 coli urinary tract infection 05/24/2021   Vaginal dryness 04/25/2021   Encounter for screening fecal occult blood testing 04/25/2021   Encounter for gynecological examination with Papanicolaou smear of cervix 04/25/2021   Iron deficiency anemia due to chronic blood loss 03/31/2021   Menorrhagia with irregular cycle 03/31/2021   Abdominal pain 04/20/2020   GERD (gastroesophageal reflux disease) 01/15/2020   Anemia 01/15/2020   Constipation 01/15/2020   Dysmenorrhea 12/25/2018   Dyspareunia in female 12/25/2018   Chest pain 07/21/2014   Dyspnea 07/21/2014   Palpitations 07/21/2014   Irregular menstrual bleeding 04/23/2013   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   Hyperlipidemia 2014    Past Surgical History:  Procedure Laterality Date   BIOPSY  02/09/2020   Procedure: BIOPSY;  Surgeon: Lanelle Bal, DO;  [facility];  Service: Endoscopy;;   CHOLECYSTECTOMY     COLONOSCOPY WITH PROPOFOL N/A 02/09/2020   Procedure: COLONOSCOPY WITH PROPOFOL;  Surgeon: Lanelle Bal, DO;  [facility];  Service: Endoscopy;  Laterality: N/A;  8:00am   ESOPHAGOGASTRODUODENOSCOPY (EGD) WITH PROPOFOL N/A 02/09/2020   Procedure: ESOPHAGOGASTRODUODENOSCOPY (EGD) WITH PROPOFOL;   Surgeon: Lanelle Bal, DO;  [facility];  Service: Endoscopy;  Laterality: N/A;   TUBAL LIGATION      OB History     Gravida  4   Para  2   Term      Preterm  2   AB  2   Living  1  SAB  2   IAB      Ectopic      Multiple      Live Births  2            Home Medications    Prior to Admission medications   Medication Sig Start Date End Date Taking? Authorizing Provider  acetaminophen (TYLENOL) 500 MG tablet Take 500 mg by mouth every 6 (six) hours as needed.    [provider]  amoxicillin (AMOXIL) 875 MG tablet Take 875 mg by mouth 2 (two) times daily. Patient not taking: Reported on 05/17/2022 04/28/22   [provider]  azithromycin (ZITHROMAX) 250 MG tablet Take 250 mg by mouth. Patient not taking: Reported on 05/17/2022 04/27/22   [provider]  benzonatate (TESSALON) 200 MG capsule Take 200 mg by mouth 3 (three) times daily. Patient not taking: Reported on 05/17/2022 04/28/22   [provider]  cyclobenzaprine (FLEXERIL) 10 MG tablet Take 10 mg by mouth 3 (three) times daily as needed for muscle spasms. Patient not taking: Reported on 05/17/2022 12/28/20   [provider]  ferrous sulfate 325 (65 FE) MG tablet Take 1 tablet (325 mg total) by mouth daily. Patient not taking: Reported on 08/17/2021 11/02/20   Jacalyn Lefevre, MD  fluticasone [facility]) 50 MCG/ACT nasal spray Place 1 spray into both nostrils daily. 05/17/22   Jacalyn Lefevre, MD  guaiFENesin-codeine 100-10 MG/5ML syrup Take 10 mLs by mouth every 6 (six) hours as needed for cough. Patient not taking: Reported on 05/17/2022 04/28/22   [provider]  loratadine (CLARITIN) 10 MG tablet Take 10 mg by mouth daily as needed for allergies.    [provider]  megestrol (MEGACE) 40 MG tablet TAKE 1 TABLET BY MOUTH DAILY 01/16/23   Adline Potter, NP  pantoprazole (PROTONIX) 40 MG tablet TAKE 1 TABLET(40 MG) BY MOUTH  DAILY Patient not taking: Reported on 05/17/2022 05/24/21   Gelene Mink, NP  predniSONE (DELTASONE) 50 MG tablet Take 1 tablet (50 mg total) by mouth daily with breakfast. 05/17/22   Jacalyn Lefevre, MD    Family History Family History  Problem Relation Age of Onset   Fibroids Mother    Diabetes Mother    Diabetes Father    Diabetes Maternal Grandmother    Heart failure Maternal Grandmother    Other Son        MVA   Colon cancer Neg Hx    Gastric cancer Neg Hx     Social History Social History   Tobacco Use   Smoking status: Never   Smokeless tobacco: Never  Vaping Use   Vaping status: Never Used  Substance Use Topics   Alcohol use: Yes    Comment: occasional   Drug use: No     Allergies   Patient has no known allergies.   Review of Systems Review of Systems   Physical Exam Triage Vital Signs ED Triage Vitals  Encounter Vitals Group     BP 05/07/23 1811 125/85     Systolic BP Percentile --      Diastolic BP Percentile --      Pulse Rate 05/07/23 1811 97     Resp 05/07/23 1811 18     Temp 05/07/23 1811 98.3 F (36.8 C)     Temp Source 05/07/23 1811 Oral     SpO2 05/07/23 1811 98 %     Weight --      Height --  Head Circumference --      Peak Flow --      Pain Score 05/07/23 1812 5     Pain Loc --      Pain Education --      Exclude from Growth Chart --    No data found.  Updated Vital Signs BP 125/85 (BP Location: Right Arm)   Pulse 97   Temp 98.3 F (36.8 C) (Oral)   Resp 18   SpO2 98%   Visual Acuity Right Eye Distance:   Left Eye Distance:   Bilateral Distance:    Right Eye Near:   Left Eye Near:    Bilateral Near:     Physical Exam   UC Treatments / Results  Labs (all labs ordered are listed, but only abnormal results are displayed) Labs Reviewed  POCT URINALYSIS DIP (MANUAL ENTRY) - Abnormal; Notable for the following components:      Result Value   Clarity, UA hazy (*)    Spec Grav, UA >=1.030 (*)    Blood, UA  moderate (*)    Protein Ur, POC =30 (*)    Leukocytes, UA Trace (*)    All other components within normal limits    EKG   Radiology No results found.  Procedures Procedures (including critical care time)  Medications Ordered in UC Medications - No data to display  Initial Impression / Assessment and Plan / UC Course  I have reviewed the triage vital signs and the nursing notes.  Pertinent labs & imaging results that were available during my care of the patient were reviewed by me and considered in my medical decision making (see chart for details).     *** Final Clinical Impressions(s) / UC Diagnoses   Final diagnoses:  None   Discharge Instructions   None    ED Prescriptions   None    PDMP not reviewed this encounter.

## 2023-05-09 LAB — URINE CULTURE

## 2023-05-28 ENCOUNTER — Other Ambulatory Visit: Payer: Self-pay | Admitting: Adult Health

## 2023-06-01 ENCOUNTER — Ambulatory Visit (HOSPITAL_COMMUNITY): Payer: Commercial Managed Care - PPO

## 2023-06-05 ENCOUNTER — Ambulatory Visit (HOSPITAL_COMMUNITY): Payer: Commercial Managed Care - PPO | Attending: Neurosurgery

## 2023-06-05 ENCOUNTER — Telehealth (HOSPITAL_COMMUNITY): Payer: Self-pay

## 2023-06-05 DIAGNOSIS — M5459 Other low back pain: Secondary | ICD-10-CM | POA: Insufficient documentation

## 2023-06-05 DIAGNOSIS — R262 Difficulty in walking, not elsewhere classified: Secondary | ICD-10-CM | POA: Insufficient documentation

## 2023-06-05 DIAGNOSIS — M545 Low back pain, unspecified: Secondary | ICD-10-CM | POA: Insufficient documentation

## 2023-06-05 NOTE — Telephone Encounter (Signed)
Patient did not show for evaluation appointment.  Tried to call her but her voicemail was full so could not leve a message.   3:00 PM, 06/05/23 Raven Smith Raven Smith MPT [facility] [place] [phone]

## 2023-06-06 NOTE — Therapy (Signed)
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   Hyperlipidemia 2014   rx'd zocor; didn't do   Irregular menstrual bleeding 04/23/2013   Past Surgical History:  Procedure Laterality Date   BIOPSY  02/09/2020   Procedure: BIOPSY;  Surgeon: Lanelle Bal, DO;  [facility];  Service: Endoscopy;;   CHOLECYSTECTOMY     COLONOSCOPY WITH PROPOFOL N/A 02/09/2020   Procedure: COLONOSCOPY WITH PROPOFOL;  Surgeon: Lanelle Bal, DO;  [facility];  Service: Endoscopy;  Laterality: N/A;  8:00am   ESOPHAGOGASTRODUODENOSCOPY (EGD) WITH PROPOFOL N/A 02/09/2020   Procedure: ESOPHAGOGASTRODUODENOSCOPY (EGD) WITH PROPOFOL;  Surgeon: Lanelle Bal, DO;  [facility];  Service: Endoscopy;  Laterality: N/A;   TUBAL LIGATION     Patient Active Problem List   Diagnosis Date Noted   E. coli urinary tract infection 05/24/2021   Vaginal dryness 04/25/2021   Encounter for screening fecal occult blood testing 04/25/2021   Encounter for gynecological examination with Papanicolaou smear of cervix 04/25/2021   Iron deficiency anemia due to chronic blood loss 03/31/2021   Menorrhagia with irregular cycle 03/31/2021   Abdominal pain 04/20/2020   GERD (gastroesophageal reflux disease) 01/15/2020   Anemia 01/15/2020   Constipation 01/15/2020   Dysmenorrhea 12/25/2018   Dyspareunia in female 12/25/2018   Chest pain 07/21/2014   Dyspnea 07/21/2014   Palpitations 07/21/2014    Irregular menstrual bleeding 04/23/2013   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  No fall or injury that she can attribute it to; saw PCP; did an MRI; referred to Dr. Maisie Fus; got an epidural 05/21/23; was sore for a few weeks; maybe now a little better; just very uncomfortable in the low back; pain with twisting; sometimes sharp.  Pain with bending forward; disturbs sleep  PERTINENT HISTORY:  Her daughter was here for PT  PAIN:  Are you having pain? Yes: NPRS scale: 5/10; highest 9/10 Pain location: low back; middle Pain description: sharp, tight, aching and throbbing Aggravating factors: bending, twisting Relieving factors: Aleve  PRECAUTIONS: None  Right side some N/T; has been going on a few weeks; both knees and feet hurt sometimes; tingling in both feet right much worse than left  WEIGHT BEARING RESTRICTIONS: No  FALLS:  Has patient fallen in last 6 months? No   PLOF: Independent  PATIENT GOALS: be able to walk, sit and move without pain  NEXT MD VISIT: April 7th, 2025  OBJECTIVE:  Note: Objective measures were completed at Evaluation unless otherwise noted.  DIAGNOSTIC FINDINGS:  IMPRESSION: 1. At L4-5 there is a mild disc bulge with a right  foraminal/lateral disc protrusion in close proximity to the right L4 nerve root. Mild right foraminal stenosis. No left foraminal stenosis. Mild bilateral lateral recess narrowing. 2. At L5-S1 there is a mild disc bulge with a small central disc protrusion. Mild bilateral facet arthropathy. 3. No acute osseous injury of the lumbar spine.     Electronically Signed   By: Elige Ko M.D.   On: 04/25/2023 12:55  PATIENT SURVEYS:  Modified Oswestry 29/50   COGNITION: Overall cognitive status: Within functional limits for tasks assessed     SENSATION: Reports of numbness both feet  POSTURE: decreased lumbar lordosis  PALPATION: Tender L4-S1 paraspinals  LUMBAR ROM:   AROM eval  Flexion Finger tips to mid shin  Extension 25% available  Right lateral flexion   Left lateral flexion   Right rotation   Left rotation    (Blank rows = not tested)  LOWER EXTREMITY ROM:     Active  Right eval Left eval  Hip flexion    Hip extension    Hip abduction    Hip adduction    Hip internal rotation    Hip external rotation    Knee flexion    Knee extension    Ankle dorsiflexion    Ankle plantarflexion    Ankle inversion    Ankle eversion     (Blank rows = not tested)  LOWER EXTREMITY MMT:    MMT Right eval Left eval  Hip flexion 4 5  Hip extension 3- 3+  Hip abduction    Hip adduction    Hip internal rotation    Hip external rotation    Knee flexion 4- 4+  Knee extension 4+ 5  Ankle dorsiflexion 4 4  Ankle plantarflexion    Ankle inversion    Ankle eversion     (Blank rows = not tested)  FUNCTIONAL TESTS:  5 times sit to stand: 15.41 sec  GAIT: Distance walked: 50 ft  in gym Assistive device utilized: None Level of assistance: Modified independence Comments: decreased gait speed  TREATMENT DATE: 06/07/23 physical therapy evaluation and HEP instruction  PATIENT EDUCATION:  Education details: Patient educated on exam findings, POC, scope of PT, HEP, and what to expect next visit. Person educated: Patient Education method: Explanation, Demonstration, and Handouts Education comprehension: verbalized understanding, returned demonstration, verbal cues required, and tactile cues required  HOME EXERCISE PROGRAM: Access Code: [email or web address] URL: [email or web address] Date: 06/07/2023 Prepared by: [facility]  Exercises - Prone Press Up  - 4-5 x daily - 7 x weekly - 1 sets - 10 reps - Standing Lumbar Extension with Counter  - 4-5 x daily - 7 x weekly - 1 sets - 10 reps  ASSESSMENT:  CLINICAL IMPRESSION: Patient is a 53 y.o. female who was seen today for physical therapy evaluation and treatment for M51.27 (ICD-10-CM) - Herniated nucleus pulposus, L5-S1.  Patient demonstrates muscle weakness, reduced ROM, and fascial restrictions which are likely contributing to symptoms of pain and are negatively impacting patient ability to perform ADLs and functional mobility tasks. Patient will benefit from skilled physical therapy services to address these deficits to reduce pain and improve level of function with ADLs and functional mobility tasks.   OBJECTIVE IMPAIRMENTS: Abnormal gait, decreased activity tolerance, decreased mobility, decreased ROM, decreased strength, hypomobility, increased fascial restrictions, impaired perceived functional ability, postural dysfunction, and pain.   ACTIVITY LIMITATIONS: carrying, lifting, bending, sitting, standing, sleeping, and locomotion level  PARTICIPATION LIMITATIONS: meal prep, cleaning, laundry, driving, shopping, and community activity  REHAB POTENTIAL: Good  CLINICAL DECISION MAKING: Stable/uncomplicated  EVALUATION COMPLEXITY: Low   GOALS: Goals reviewed with patient? No  SHORT TERM GOALS: Target date: 06/21/2023  patient will be independent with initial  HEP  Baseline: Goal status: INITIAL  2.  Patient will self report 50% improvement to improve tolerance for functional activity  Baseline:  Goal status: INITIAL   LONG TERM GOALS: Target date: 07/05/2023  Patient will be independent in self management strategies to improve quality of life and functional outcomes.  Baseline:  Goal status: INITIAL  2.  Patient will self report 50% improvement to improve tolerance for functional activity  Baseline:  Goal status: INITIAL  3.  Patient will increase right leg MMT's to 4+ to 5/5 to allow navigation of steps without gait deviation or loss of balance   Baseline: see above Goal status: INITIAL  4.  Patient will improve 5 times sit to stand score from 15.41 sec to 12 sec or less to demonstrate improved functional mobility and increased leg strength.    Baseline:  Goal status: INITIAL  PLAN:  PT FREQUENCY: 2x/week  PT DURATION: 4 weeks  PLANNED INTERVENTIONS: 97164- PT Re-evaluation, 97110-Therapeutic exercises, 97530- Therapeutic activity, 97112- Neuromuscular re-education, 97535- Self Care, 78295- Manual therapy, [phone]- Gait training, [phone]- Orthotic Fit/training, [phone]- Canalith repositioning, [id number]- Aquatic Therapy, [phone]- Splinting, Patient/Family education, Balance training, Stair training, Taping, Dry Needling, Joint mobilization, Joint manipulation, Spinal manipulation, Spinal mobilization, Scar mobilization, and DME instructions. Marland Kitchen  PLAN FOR NEXT SESSION: Review HEP and goals; extension based first then core and postural strengthening  3:27 PM, 06/07/23 Anh Bigos Small Violanda Bobeck MPT [place] [place] [facility] [place] [phone]

## 2023-06-07 ENCOUNTER — Other Ambulatory Visit: Payer: Self-pay

## 2023-06-07 ENCOUNTER — Ambulatory Visit (HOSPITAL_COMMUNITY): Payer: Commercial Managed Care - PPO

## 2023-06-07 DIAGNOSIS — R262 Difficulty in walking, not elsewhere classified: Secondary | ICD-10-CM | POA: Diagnosis present

## 2023-06-07 DIAGNOSIS — M545 Low back pain, unspecified: Secondary | ICD-10-CM

## 2023-06-07 DIAGNOSIS — M5459 Other low back pain: Secondary | ICD-10-CM

## 2023-06-18 ENCOUNTER — Telehealth (HOSPITAL_COMMUNITY): Payer: Self-pay

## 2023-06-18 ENCOUNTER — Encounter (HOSPITAL_COMMUNITY): Payer: Commercial Managed Care - PPO

## 2023-06-18 NOTE — Telephone Encounter (Signed)
Called and spoke with patient; she did not have that appt listed on her scheduled; but is aware of her next appt on 2/12.     3:15 PM, 06/18/23 Raven Smith Small Azarah Dacy MPT [facility] [place] [phone]

## 2023-06-27 ENCOUNTER — Ambulatory Visit (HOSPITAL_COMMUNITY): Payer: Commercial Managed Care - PPO | Attending: Neurosurgery

## 2023-06-27 DIAGNOSIS — M5459 Other low back pain: Secondary | ICD-10-CM | POA: Insufficient documentation

## 2023-06-27 DIAGNOSIS — R262 Difficulty in walking, not elsewhere classified: Secondary | ICD-10-CM | POA: Insufficient documentation

## 2023-06-27 DIAGNOSIS — M545 Low back pain, unspecified: Secondary | ICD-10-CM | POA: Insufficient documentation

## 2023-06-27 NOTE — Therapy (Signed)
OUTPATIENT PHYSICAL THERAPY THORACOLUMBAR TREATMENT   Patient Name: Raven Smith MRN: [id number] DOB:1971-01-07, 53 y.o., female Today's Date: 06/27/2023  END OF SESSION:  PT End of Session - 06/27/23 1101     Visit Number 2    Number of Visits 8    Date for PT Re-Evaluation 07/05/23    Authorization Type [employer]    PT Start Time 1101    PT Stop Time 1141    PT Time Calculation (min) 40 min    Activity Tolerance Patient tolerated treatment well    Behavior During Therapy [facility] for tasks assessed/performed             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   Hyperlipidemia 2014   rx'd zocor; didn't do   Irregular menstrual bleeding 04/23/2013   Past Surgical History:  Procedure Laterality Date   BIOPSY  02/09/2020   Procedure: BIOPSY;  Surgeon: Lanelle Bal, DO;  [facility];  Service: Endoscopy;;   CHOLECYSTECTOMY     COLONOSCOPY WITH PROPOFOL N/A 02/09/2020   Procedure: COLONOSCOPY WITH PROPOFOL;  Surgeon: Lanelle Bal, DO;  [facility];  Service: Endoscopy;  Laterality: N/A;  8:00am   ESOPHAGOGASTRODUODENOSCOPY (EGD) WITH PROPOFOL N/A 02/09/2020   Procedure: ESOPHAGOGASTRODUODENOSCOPY (EGD) WITH PROPOFOL;  Surgeon: Lanelle Bal, DO;  [facility];  Service: Endoscopy;  Laterality: N/A;   TUBAL LIGATION     Patient Active Problem List   Diagnosis Date Noted   E. coli urinary tract infection 05/24/2021   Vaginal dryness 04/25/2021   Encounter for screening fecal occult blood testing 04/25/2021   Encounter for gynecological examination with Papanicolaou smear of cervix 04/25/2021   Iron deficiency anemia due to chronic blood loss 03/31/2021   Menorrhagia with irregular cycle 03/31/2021   Abdominal pain 04/20/2020   GERD (gastroesophageal reflux disease) 01/15/2020   Anemia 01/15/2020   Constipation 01/15/2020   Dysmenorrhea 12/25/2018    Dyspareunia in female 12/25/2018   Chest pain 07/21/2014   Dyspnea 07/21/2014   Palpitations 07/21/2014   Irregular menstrual bleeding 04/23/2013   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  Did her exercises "a couple of times" felt like she moved better afterwards.   Chronic pain for years but this was definitely worse/more.  No fall or injury that she can attribute it to; saw PCP; did an MRI; referred to Dr. Maisie Fus; got an epidural 05/21/23; was sore for a few weeks; maybe now a little better; just very uncomfortable in the low back; pain with twisting; sometimes sharp.  Pain with bending forward; disturbs sleep  PERTINENT HISTORY:  Her daughter was here for PT  PAIN:  Are you having pain? Yes: NPRS scale: 5/10; highest 9/10 Pain location: low back; middle Pain description: sharp, tight, aching and throbbing Aggravating factors: bending, twisting Relieving factors: Aleve  PRECAUTIONS: None  Right side some N/T; has been going on a few weeks; both knees and feet hurt sometimes; tingling in both feet right much worse than left  WEIGHT BEARING RESTRICTIONS: No  FALLS:  Has patient fallen in last 6 months? No   PLOF: Independent  PATIENT  GOALS: be able to walk, sit and move without pain  NEXT MD VISIT: April 7th, 2025  OBJECTIVE:  Note: Objective measures were completed at Evaluation unless otherwise noted.  DIAGNOSTIC FINDINGS:  IMPRESSION: 1. At L4-5 there is a mild disc bulge with a right foraminal/lateral disc protrusion in close proximity to the right L4 nerve root. Mild right foraminal stenosis. No left foraminal stenosis. Mild bilateral lateral recess narrowing. 2. At L5-S1 there is a mild disc bulge with a small central disc protrusion. Mild bilateral facet arthropathy. 3. No acute osseous injury of the lumbar spine.     Electronically Signed   By: Elige Ko M.D.   On: 04/25/2023 12:55  PATIENT SURVEYS:  Modified Oswestry 29/50   COGNITION: Overall cognitive status: Within functional limits for tasks assessed     SENSATION: Reports of numbness both feet  POSTURE: decreased lumbar lordosis  PALPATION: Tender L4-S1 paraspinals  LUMBAR ROM:   AROM eval  Flexion Finger tips to mid shin  Extension 25% available  Right lateral flexion   Left lateral flexion   Right rotation   Left rotation    (Blank rows = not tested)  LOWER EXTREMITY ROM:     Active  Right eval Left eval  Hip flexion    Hip extension    Hip abduction    Hip adduction    Hip internal rotation    Hip external rotation    Knee flexion    Knee extension    Ankle dorsiflexion    Ankle plantarflexion    Ankle inversion    Ankle eversion     (Blank rows = not tested)  LOWER EXTREMITY MMT:    MMT Right eval Left eval  Hip flexion 4 5  Hip extension 3- 3+  Hip abduction    Hip adduction    Hip internal rotation    Hip external rotation    Knee flexion 4- 4+  Knee extension 4+ 5  Ankle dorsiflexion 4 4  Ankle plantarflexion    Ankle inversion    Ankle eversion     (Blank rows = not tested)  FUNCTIONAL TESTS:  5 times sit to stand: 15.41 sec  GAIT: Distance walked: 50 ft  in gym Assistive device  utilized: None Level of assistance: Modified independence Comments: decreased gait speed  TREATMENT DATE:  06/27/23 Review of HEP and goals Prone lying with  moist heat to low back x 5' Manual thoracic and lumbar mobilization grade 1 and 2 and STM x 8' Education on self trigger point massage with tennis ball Supine: Transverse abdominus 5" x 10 Bridge x 10 Update HEP  06/07/23 physical therapy evaluation and HEP instruction                                                                                                                                 PATIENT EDUCATION:  Education details: Patient educated on exam findings, POC, scope of PT, HEP, and what to expect next visit. Person educated: Patient Education method: Explanation, Demonstration, and Handouts Education comprehension: verbalized understanding, returned demonstration, verbal cues required, and tactile cues required  HOME EXERCISE PROGRAM: Access Code: [email or web address] Date: 06/27/2023 - Supine Transversus Abdominis Bracing - Hands on Stomach  - 1 x daily - 7 x weekly - 1 sets - 10 reps - 5 sec hold - Supine Bridge  - 1 x daily - 7 x weekly - 1 sets - 10 reps Access Code: [email or web address] URL: [email or web address] Date: 06/07/2023 Prepared by: [facility]  Exercises - Prone Press Up  - 4-5 x daily - 7 x weekly - 1 sets - 10 reps - Standing Lumbar Extension with Counter  - 4-5 x daily - 7 x weekly - 1 sets - 10 reps  ASSESSMENT:  CLINICAL IMPRESSION: Today's session started with a review of HEP and goals.  Patient verbalizes agreement with set rehab goals. Patient continues with limited lumbar extension; moist heat followed by manual mobilizations to thoracic and lumbar spine and STM to bilateral paraspinals.  Introduced core strengthening and updated HEP.  Instructed patient in self mobilization with tennis ball with good return demonstration.  Patient will benefit from continued skilled therapy services to address  deficits and promote return to optimal function.      Eval:Patient is a 53 y.o. female who was seen today for physical therapy evaluation and treatment for M51.27 (ICD-10-CM) - Herniated nucleus pulposus, L5-S1.  Patient demonstrates muscle weakness, reduced ROM, and fascial restrictions which are likely contributing to symptoms of pain and are negatively impacting patient ability to perform ADLs and functional mobility tasks. Patient will benefit from skilled physical therapy services to address these deficits to reduce pain and improve level of function with ADLs and functional mobility tasks.   OBJECTIVE IMPAIRMENTS: Abnormal gait, decreased activity tolerance, decreased mobility, decreased ROM, decreased strength, hypomobility, increased fascial restrictions, impaired perceived functional ability, postural dysfunction, and pain.   ACTIVITY LIMITATIONS: carrying, lifting, bending, sitting, standing, sleeping, and locomotion level  PARTICIPATION LIMITATIONS: meal prep, cleaning, laundry, driving, shopping, and community activity  REHAB POTENTIAL: Good  CLINICAL DECISION MAKING: Stable/uncomplicated  EVALUATION COMPLEXITY: Low   GOALS: Goals reviewed with patient? No  SHORT TERM GOALS: Target date: 06/21/2023  patient will be independent with initial HEP  Baseline: Goal status: In progress  2.  Patient will self report 50%  improvement to improve tolerance for functional activity  Baseline:  Goal status: in progress   LONG TERM GOALS: Target date: 07/05/2023  Patient will be independent in self management strategies to improve quality of life and functional outcomes.  Baseline:  Goal status: in progress  2.  Patient will self report 75% improvement to improve tolerance for functional activity  Baseline:  Goal status: in progress  3.  Patient will increase right leg MMT's to 4+ to 5/5 to allow navigation of steps without gait deviation or loss of balance   Baseline:  see above Goal status: in progress  4.  Patient will improve 5 times sit to stand score from 15.41 sec to 12 sec or less to demonstrate improved functional mobility and increased leg strength.    Baseline:  Goal status: in progress  PLAN:  PT FREQUENCY: 2x/week  PT DURATION: 4 weeks  PLANNED INTERVENTIONS: 97164- PT Re-evaluation, 97110-Therapeutic exercises, 97530- Therapeutic activity, 97112- Neuromuscular re-education, 97535- Self Care, 47829- Manual therapy, [phone]- Gait training, [phone]- Orthotic Fit/training, [phone]- Canalith repositioning, [id number]- Aquatic Therapy, [phone]- Splinting, Patient/Family education, Balance training, Stair training, Taping, Dry Needling, Joint mobilization, Joint manipulation, Spinal manipulation, Spinal mobilization, Scar mobilization, and DME instructions. Marland Kitchen  PLAN FOR NEXT SESSION:  extension based first then core and postural strengthening  11:53 AM, 06/27/23 Sueellen Kayes Small Tenita Cue MPT [place] [facility] [place] [phone] Ph:[phone]

## 2023-06-28 ENCOUNTER — Ambulatory Visit (HOSPITAL_COMMUNITY): Payer: Commercial Managed Care - PPO

## 2023-06-28 DIAGNOSIS — M545 Low back pain, unspecified: Secondary | ICD-10-CM

## 2023-06-28 DIAGNOSIS — M5459 Other low back pain: Secondary | ICD-10-CM | POA: Diagnosis not present

## 2023-06-28 DIAGNOSIS — R262 Difficulty in walking, not elsewhere classified: Secondary | ICD-10-CM

## 2023-06-28 NOTE — Therapy (Signed)
OUTPATIENT PHYSICAL THERAPY THORACOLUMBAR TREATMENT   Patient Name: Raven Smith MRN: [id number] DOB:[birth year], 53 y.o., female Today's Date: 06/28/2023  END OF SESSION:  PT End of Session - 06/28/23 1151     Visit Number 3    Number of Visits 8    Date for PT Re-Evaluation 07/05/23    Authorization Type [employer]    PT Start Time 1150    PT Stop Time 1230    PT Time Calculation (min) 40 min    Activity Tolerance Patient tolerated treatment well    Behavior During Therapy [facility] for tasks assessed/performed             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   Hyperlipidemia 2014   rx'd zocor; didn't do   Irregular menstrual bleeding 04/23/2013   Past Surgical History:  Procedure Laterality Date   BIOPSY  02/09/2020   Procedure: BIOPSY;  Surgeon: Lanelle Bal, DO;  [facility];  Service: Endoscopy;;   CHOLECYSTECTOMY     COLONOSCOPY WITH PROPOFOL N/A 02/09/2020   Procedure: COLONOSCOPY WITH PROPOFOL;  Surgeon: Lanelle Bal, DO;  [facility];  Service: Endoscopy;  Laterality: N/A;  8:00am   ESOPHAGOGASTRODUODENOSCOPY (EGD) WITH PROPOFOL N/A 02/09/2020   Procedure: ESOPHAGOGASTRODUODENOSCOPY (EGD) WITH PROPOFOL;  Surgeon: Lanelle Bal, DO;  [facility];  Service: Endoscopy;  Laterality: N/A;   TUBAL LIGATION     Patient Active Problem List   Diagnosis Date Noted   E. coli urinary tract infection 05/24/2021   Vaginal dryness 04/25/2021   Encounter for screening fecal occult blood testing 04/25/2021   Encounter for gynecological examination with Papanicolaou smear of cervix 04/25/2021   Iron deficiency anemia due to chronic blood loss 03/31/2021   Menorrhagia with irregular cycle 03/31/2021   Abdominal pain 04/20/2020   GERD (gastroesophageal reflux disease) 01/15/2020   Anemia 01/15/2020   Constipation 01/15/2020   Dysmenorrhea 12/25/2018    Dyspareunia in female 12/25/2018   Chest pain 07/21/2014   Dyspnea 07/21/2014   Palpitations 07/21/2014   Irregular menstrual bleeding 04/23/2013   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  No fall or injury that she can attribute it to; saw PCP; did an MRI; referred to Dr. Maisie Fus; got an epidural 05/21/23; was sore for a few weeks; maybe now a little better; just very uncomfortable in the low back; pain with twisting; sometimes sharp.  Pain with bending forward; disturbs sleep  PERTINENT HISTORY:  Her daughter was here for PT  PAIN:  Are you having pain? Yes: NPRS scale: 5/10; highest 9/10 Pain location: low back; middle Pain description: sharp, tight, aching and throbbing Aggravating factors: bending, twisting Relieving factors: Aleve  PRECAUTIONS: None  Right side some N/T; has been going on a few weeks; both knees and feet hurt sometimes; tingling in both feet right much worse than left  WEIGHT BEARING RESTRICTIONS: No  FALLS:  Has patient fallen in last 6 months? No   PLOF: Independent  PATIENT GOALS: be able to walk, sit and move without pain  NEXT MD VISIT: April 7th, 2025  OBJECTIVE:  Note: Objective  measures were completed at Evaluation unless otherwise noted.  DIAGNOSTIC FINDINGS:  IMPRESSION: 1. At L4-5 there is a mild disc bulge with a right foraminal/lateral disc protrusion in close proximity to the right L4 nerve root. Mild right foraminal stenosis. No left foraminal stenosis. Mild bilateral lateral recess narrowing. 2. At L5-S1 there is a mild disc bulge with a small central disc protrusion. Mild bilateral facet arthropathy. 3. No acute osseous injury of the lumbar spine.     Electronically Signed   By: Elige Ko M.D.   On: 04/25/2023 12:55  PATIENT SURVEYS:  Modified Oswestry 29/50   COGNITION: Overall cognitive status: Within functional limits for tasks assessed     SENSATION: Reports of numbness both feet  POSTURE: decreased lumbar lordosis  PALPATION: Tender L4-S1 paraspinals  LUMBAR ROM:   AROM eval  Flexion Finger tips to mid shin  Extension 25% available  Right lateral flexion   Left lateral flexion   Right rotation   Left rotation    (Blank rows = not tested)  LOWER EXTREMITY ROM:     Active  Right eval Left eval  Hip flexion    Hip extension    Hip abduction    Hip adduction    Hip internal rotation    Hip external rotation    Knee flexion    Knee extension    Ankle dorsiflexion    Ankle plantarflexion    Ankle inversion    Ankle eversion     (Blank rows = not tested)  LOWER EXTREMITY MMT:    MMT Right eval Left eval  Hip flexion 4 5  Hip extension 3- 3+  Hip abduction    Hip adduction    Hip internal rotation    Hip external rotation    Knee flexion 4- 4+  Knee extension 4+ 5  Ankle dorsiflexion 4 4  Ankle plantarflexion    Ankle inversion    Ankle eversion     (Blank rows = not tested)  FUNCTIONAL TESTS:  5 times sit to stand: 15.41 sec  GAIT: Distance walked: 50 ft  in gym Assistive device utilized: None Level of assistance: Modified independence Comments: decreased gait speed  TREATMENT DATE:   06/28/23 Prone: Prone lying with moist heat x 5' Glute sets 5" hold x 10 Press ups x 10 Supine: Abdominal bracing 5" x 10 Bridge x 15 LRT  x 10 Dead bug x 10 Updated HEP  2023-07-04 Review of HEP and goals Prone lying with moist heat to low back x 5' Manual thoracic and lumbar mobilization grade 1 and 2 and STM x 8' Education on self trigger point massage with tennis ball Supine: Transverse abdominus 5" x 10 Bridge x 10 Update HEP  06/07/23 physical therapy evaluation and HEP instruction                                                                                                                                 PATIENT EDUCATION:  Education details: Patient educated on exam findings, POC, scope of PT, HEP, and what to expect next visit. Person educated: Patient Education method: Explanation, Demonstration, and Handouts Education comprehension: verbalized understanding, returned demonstration, verbal cues required, and tactile cues required  HOME EXERCISE PROGRAM: Access Code: [email or web address] URL: [email or web address] Date: 06/28/2023  -Supine Lower Trunk Rotation  - 1 x daily - 7 x weekly - 1 sets - 10 reps - Supine Dead Bug with Leg Extension  - 1 x daily - 7 x weekly - 1 sets - 10 reps   Access Code: [email or web address] Date: 07-04-23 - Supine Transversus Abdominis Bracing - Hands on Stomach  - 1 x daily - 7 x weekly - 1 sets - 10 reps - 5 sec hold - Supine Bridge  - 1 x daily - 7 x weekly - 1 sets - 10 reps Access Code: [email or web address] URL: [email or web address] Date: 06/07/2023 Prepared by: [facility]  Exercises - Prone Press Up  - 4-5 x daily - 7 x weekly - 1 sets - 10 reps - Standing Lumbar Extension with Counter  - 4-5 x daily - 7 x weekly - 1 sets - 10 reps  ASSESSMENT:  CLINICAL IMPRESSION: Today's session started with prone lying with moist heat to decrease lumbar stiffness. Progressed lumbar mobility and core strengthening today.  Patient needs  cues for breathing and pacing of activity. Has some trouble initially coordinating dead bug but improves with continued reps.  Updated HEP.   Patient will benefit from continued skilled therapy services to address deficits and promote return to optimal function.      Eval:Patient is a 53 y.o. female who was seen today for physical therapy evaluation and treatment for M51.27 (ICD-10-CM) - Herniated nucleus pulposus, L5-S1.  Patient demonstrates muscle weakness, reduced ROM, and fascial restrictions which are likely contributing to symptoms of pain and are negatively impacting patient ability to perform ADLs and functional mobility tasks. Patient will benefit from skilled physical therapy services to address these deficits to reduce pain and improve level of function with ADLs and functional mobility tasks.   OBJECTIVE IMPAIRMENTS: Abnormal gait, decreased activity tolerance, decreased mobility, decreased ROM, decreased strength, hypomobility, increased fascial restrictions, impaired perceived functional ability, postural dysfunction, and pain.   ACTIVITY LIMITATIONS: carrying, lifting, bending, sitting, standing, sleeping, and locomotion level  PARTICIPATION LIMITATIONS: meal prep, cleaning, laundry,  driving, shopping, and community activity  REHAB POTENTIAL: Good  CLINICAL DECISION MAKING: Stable/uncomplicated  EVALUATION COMPLEXITY: Low   GOALS: Goals reviewed with patient? No  SHORT TERM GOALS: Target date: 06/21/2023  patient will be independent with initial HEP  Baseline: Goal status: In progress  2.  Patient will self report 50% improvement to improve tolerance for functional activity  Baseline:  Goal status: in progress   LONG TERM GOALS: Target date: 07/05/2023  Patient will be independent in self management strategies to improve quality of life and functional outcomes.  Baseline:  Goal status: in progress  2.  Patient will self report 75% improvement to improve  tolerance for functional activity  Baseline:  Goal status: in progress  3.  Patient will increase right leg MMT's to 4+ to 5/5 to allow navigation of steps without gait deviation or loss of balance   Baseline: see above Goal status: in progress  4.  Patient will improve 5 times sit to stand score from 15.41 sec to 12 sec or less to demonstrate improved functional mobility and increased leg strength.    Baseline:  Goal status: in progress  PLAN:  PT FREQUENCY: 2x/week  PT DURATION: 4 weeks  PLANNED INTERVENTIONS: 97164- PT Re-evaluation, 97110-Therapeutic exercises, 97530- Therapeutic activity, 97112- Neuromuscular re-education, 97535- Self Care, 30865- Manual therapy, [phone]- Gait training, [phone]- Orthotic Fit/training, [phone]- Canalith repositioning, [id number]- Aquatic Therapy, [phone]- Splinting, Patient/Family education, Balance training, Stair training, Taping, Dry Needling, Joint mobilization, Joint manipulation, Spinal manipulation, Spinal mobilization, Scar mobilization, and DME instructions. Marland Kitchen  PLAN FOR NEXT SESSION:  extension based first then core and postural strengthening  12:33 PM, 06/28/23 Naureen Benton Small Annet Manukyan MPT [place] [facility] [place] [phone]

## 2023-07-03 ENCOUNTER — Ambulatory Visit (HOSPITAL_COMMUNITY): Payer: Commercial Managed Care - PPO

## 2023-07-03 DIAGNOSIS — M5459 Other low back pain: Secondary | ICD-10-CM | POA: Diagnosis not present

## 2023-07-03 DIAGNOSIS — M545 Low back pain, unspecified: Secondary | ICD-10-CM

## 2023-07-03 DIAGNOSIS — R262 Difficulty in walking, not elsewhere classified: Secondary | ICD-10-CM

## 2023-07-03 NOTE — Therapy (Signed)
OUTPATIENT PHYSICAL THERAPY THORACOLUMBAR TREATMENT   Patient Name: Raven Smith MRN: [id number] DOB:[birth year], 53 y.o., female Today's Date: 07/03/2023  END OF SESSION:  PT End of Session - 07/03/23 1107     Visit Number 4    Number of Visits 8    Date for PT Re-Evaluation 07/05/23    Authorization Type UHC    PT Start Time 1106    PT Stop Time 1145    PT Time Calculation (min) 39 min    Activity Tolerance Patient tolerated treatment well    Behavior During Therapy [facility] for tasks assessed/performed             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   Hyperlipidemia 2014   rx'd zocor; didn't do   Irregular menstrual bleeding 04/23/2013   Past Surgical History:  Procedure Laterality Date   BIOPSY  02/09/2020   Procedure: BIOPSY;  Surgeon: Lanelle Bal, DO;  [facility];  Service: Endoscopy;;   CHOLECYSTECTOMY     COLONOSCOPY WITH PROPOFOL N/A 02/09/2020   Procedure: COLONOSCOPY WITH PROPOFOL;  Surgeon: Lanelle Bal, DO;  [facility];  Service: Endoscopy;  Laterality: N/A;  8:00am   ESOPHAGOGASTRODUODENOSCOPY (EGD) WITH PROPOFOL N/A 02/09/2020   Procedure: ESOPHAGOGASTRODUODENOSCOPY (EGD) WITH PROPOFOL;  Surgeon: Lanelle Bal, DO;  [facility];  Service: Endoscopy;  Laterality: N/A;   TUBAL LIGATION     Patient Active Problem List   Diagnosis Date Noted   E. coli urinary tract infection 05/24/2021   Vaginal dryness 04/25/2021   Encounter for screening fecal occult blood testing 04/25/2021   Encounter for gynecological examination with Papanicolaou smear of cervix 04/25/2021   Iron deficiency anemia due to chronic blood loss 03/31/2021   Menorrhagia with irregular cycle 03/31/2021   Abdominal pain 04/20/2020   GERD (gastroesophageal reflux disease) 01/15/2020   Anemia 01/15/2020   Constipation 01/15/2020   Dysmenorrhea 12/25/2018    Dyspareunia in female 12/25/2018   Chest pain 07/21/2014   Dyspnea 07/21/2014   Palpitations 07/21/2014   Irregular menstrual bleeding 04/23/2013   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   Hyperlipidemia 2014    [facility]P: [facility]  REFERRING PROVIDER: Bedelia Person, MD  REFERRING DIAG: M51.27 (ICD-10-CM) - Herniated nucleus pulposus, L5-S1  Rationale for Evaluation and Treatment: Rehabilitation  THERAPY DIAG:  Other low back pain  Low back pain, unspecified back pain laterality, unspecified chronicity, unspecified whether sciatica present  Difficulty in walking, not elsewhere classified  ONSET DATE: Thanksgiving 2024  SUBJECTIVE:  SUBJECTIVE STATEMENT: Today is a good day but yesterday was horrible; 3/10 today but 7/10 yesterday  Chronic pain for years but this was definitely worse/more.  No fall or injury that she can attribute it to; saw PCP; did an MRI; referred to Dr. Maisie Fus; got an epidural 05/21/23; was sore for a few weeks; maybe now a little better; just very uncomfortable in the low back; pain with twisting; sometimes sharp.  Pain with bending forward; disturbs sleep  PERTINENT HISTORY:  Her daughter was here for PT  PAIN:  Are you having pain? Yes: NPRS scale: 5/10; highest 9/10 Pain location: low back; middle Pain description: sharp, tight, aching and throbbing Aggravating factors: bending, twisting Relieving factors: Aleve  PRECAUTIONS: None  Right side some N/T; has been going on a few weeks; both knees and feet hurt sometimes; tingling in both feet right much worse than left  WEIGHT BEARING RESTRICTIONS: No  FALLS:  Has patient fallen in last 6 months? No   PLOF: Independent  PATIENT GOALS: be able to walk, sit and move without pain  NEXT MD VISIT: April 7th,  2025  OBJECTIVE:  Note: Objective measures were completed at Evaluation unless otherwise noted.  DIAGNOSTIC FINDINGS:  IMPRESSION: 1. At L4-5 there is a mild disc bulge with a right foraminal/lateral disc protrusion in close proximity to the right L4 nerve root. Mild right foraminal stenosis. No left foraminal stenosis. Mild bilateral lateral recess narrowing. 2. At L5-S1 there is a mild disc bulge with a small central disc protrusion. Mild bilateral facet arthropathy. 3. No acute osseous injury of the lumbar spine.     Electronically Signed   By: Elige Ko M.D.   On: 04/25/2023 12:55  PATIENT SURVEYS:  Modified Oswestry 29/50   COGNITION: Overall cognitive status: Within functional limits for tasks assessed     SENSATION: Reports of numbness both feet  POSTURE: decreased lumbar lordosis  PALPATION: Tender L4-S1 paraspinals  LUMBAR ROM:   AROM eval  Flexion Finger tips to mid shin  Extension 25% available  Right lateral flexion   Left lateral flexion   Right rotation   Left rotation    (Blank rows = not tested)  LOWER EXTREMITY ROM:     Active  Right eval Left eval  Hip flexion    Hip extension    Hip abduction    Hip adduction    Hip internal rotation    Hip external rotation    Knee flexion    Knee extension    Ankle dorsiflexion    Ankle plantarflexion    Ankle inversion    Ankle eversion     (Blank rows = not tested)  LOWER EXTREMITY MMT:    MMT Right eval Left eval  Hip flexion 4 5  Hip extension 3- 3+  Hip abduction    Hip adduction    Hip internal rotation    Hip external rotation    Knee flexion 4- 4+  Knee extension 4+ 5  Ankle dorsiflexion 4 4  Ankle plantarflexion    Ankle inversion    Ankle eversion     (Blank rows = not tested)  FUNCTIONAL TESTS:  5 times sit to stand: 15.41 sec  GAIT: Distance walked: 50 ft  in gym Assistive device utilized: None Level of assistance: Modified independence Comments: decreased  gait speed  TREATMENT DATE:  07/03/23 Prone: Prone lying with moist heat x 5' Press ups x 10 Glute sets 5" x 10 Hamstring curls x 10 each  Shoulder extension x 10 each Hip extension x 10 each Supine: LTR x 10 Abdominal bracing 5" hold x 10 Dead bug with physioball x 10 each Standing scap retraction RTB 2 x 10 Updated HEP      06/28/23 Prone: Prone lying with moist heat x 5' Glute sets 5" hold x 10 Press ups x 10 Supine: Abdominal bracing 5" x 10 Bridge x 15 LRT x 10 Dead bug x 10 Updated HEP  07-06-23 Review of HEP and goals Prone lying with moist heat to low back x 5' Manual thoracic and lumbar mobilization grade 1 and 2 and STM x 8' Education on self trigger point massage with tennis ball Supine: Transverse abdominus 5" x 10 Bridge x 10 Update HEP  06/07/23 physical therapy evaluation and HEP instruction                                                                                                                                 PATIENT EDUCATION:  Education details: Patient educated on exam findings, POC, scope of PT, HEP, and what to expect next visit. Person educated: Patient Education method: Explanation, Demonstration, and Handouts Education comprehension: verbalized understanding, returned demonstration, verbal cues required, and tactile cues required  HOME EXERCISE PROGRAM: 07/03/23 prone hip extension, shoulder extension  Access Code: [email or web address] URL: [email or web address] Date: 06/28/2023  -Supine Lower Trunk Rotation  - 1 x daily - 7 x weekly - 1 sets - 10 reps - Supine Dead Bug with Leg Extension  - 1 x daily - 7 x weekly - 1 sets - 10 reps   Access Code: [email or web address] Date: 06-Jul-2023 - Supine Transversus Abdominis Bracing - Hands on Stomach  - 1 x daily - 7 x weekly - 1 sets - 10 reps - 5 sec hold - Supine Bridge  - 1 x daily - 7 x weekly - 1 sets - 10 reps Access Code: [email or web address] URL: [email or web address] Date:  06/07/2023 Prepared by: [facility]  Exercises - Prone Press Up  - 4-5 x daily - 7 x weekly - 1 sets - 10 reps - Standing Lumbar Extension with Counter  - 4-5 x daily - 7 x weekly - 1 sets - 10 reps  ASSESSMENT:  CLINICAL IMPRESSION: Today's session started with prone lying with moist heat to decrease lumbar stiffness. Progressed lumbar mobility and core strengthening today.  Patient needs cues for breathing and pacing of activity. Has some trouble initially coordinating dead bug but improves with use of physioball.  Progressed postural strengthening exercises today and updated HEP.   Patient will benefit from continued skilled therapy services to address deficits and promote return to optimal function.      Eval:Patient is a 53 y.o. female who was seen today for physical therapy evaluation and treatment for M51.27 (ICD-10-CM) - Herniated nucleus pulposus, L5-S1.  Patient demonstrates muscle weakness, reduced ROM, and fascial restrictions which are likely contributing to symptoms of pain and are  negatively impacting patient ability to perform ADLs and functional mobility tasks. Patient will benefit from skilled physical therapy services to address these deficits to reduce pain and improve level of function with ADLs and functional mobility tasks.   OBJECTIVE IMPAIRMENTS: Abnormal gait, decreased activity tolerance, decreased mobility, decreased ROM, decreased strength, hypomobility, increased fascial restrictions, impaired perceived functional ability, postural dysfunction, and pain.   ACTIVITY LIMITATIONS: carrying, lifting, bending, sitting, standing, sleeping, and locomotion level  PARTICIPATION LIMITATIONS: meal prep, cleaning, laundry, driving, shopping, and community activity  REHAB POTENTIAL: Good  CLINICAL DECISION MAKING: Stable/uncomplicated  EVALUATION COMPLEXITY: Low   GOALS: Goals reviewed with patient? No  SHORT TERM GOALS: Target date: 06/21/2023  patient will be  independent with initial HEP  Baseline: Goal status: In progress  2.  Patient will self report 50% improvement to improve tolerance for functional activity  Baseline:  Goal status: in progress   LONG TERM GOALS: Target date: 07/05/2023  Patient will be independent in self management strategies to improve quality of life and functional outcomes.  Baseline:  Goal status: in progress  2.  Patient will self report 75% improvement to improve tolerance for functional activity  Baseline:  Goal status: in progress  3.  Patient will increase right leg MMT's to 4+ to 5/5 to allow navigation of steps without gait deviation or loss of balance   Baseline: see above Goal status: in progress  4.  Patient will improve 5 times sit to stand score from 15.41 sec to 12 sec or less to demonstrate improved functional mobility and increased leg strength.    Baseline:  Goal status: in progress  PLAN:  PT FREQUENCY: 2x/week  PT DURATION: 4 weeks  PLANNED INTERVENTIONS: 97164- PT Re-evaluation, 97110-Therapeutic exercises, 97530- Therapeutic activity, 97112- Neuromuscular re-education, 97535- Self Care, 69629- Manual therapy, [phone]- Gait training, [phone]- Orthotic Fit/training, [phone]- Canalith repositioning, [id number]- Aquatic Therapy, [phone]- Splinting, Patient/Family education, Balance training, Stair training, Taping, Dry Needling, Joint mobilization, Joint manipulation, Spinal manipulation, Spinal mobilization, Scar mobilization, and DME instructions. Marland Kitchen  PLAN FOR NEXT SESSION:  extension based first then core and postural strengthening  11:49 AM, 07/03/23 Normon Pettijohn Small Shandell Jallow MPT [facility] [place] [phone] Ph:[phone]

## 2023-07-05 ENCOUNTER — Ambulatory Visit (HOSPITAL_COMMUNITY): Payer: Commercial Managed Care - PPO

## 2023-07-05 DIAGNOSIS — M545 Low back pain, unspecified: Secondary | ICD-10-CM

## 2023-07-05 DIAGNOSIS — M5459 Other low back pain: Secondary | ICD-10-CM

## 2023-07-05 DIAGNOSIS — R262 Difficulty in walking, not elsewhere classified: Secondary | ICD-10-CM

## 2023-07-05 NOTE — Therapy (Signed)
OUTPATIENT PHYSICAL THERAPY THORACOLUMBAR TREATMENT   Patient Name: JAHLIA OMURA MRN: [id number] DOB:09/19/1970, 53 y.o., female Today's Date: 07/05/2023  END OF SESSION:  PT End of Session - 07/05/23 1303     Visit Number 5    Number of Visits 13    Date for PT Re-Evaluation 08/02/23    Authorization Type UHC    PT Start Time 1150    PT Stop Time 1230    PT Time Calculation (min) 40 min    Activity Tolerance Patient tolerated treatment well    Behavior During Therapy [facility] for tasks assessed/performed            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   Hyperlipidemia 2014   rx'd zocor; didn't do   Irregular menstrual bleeding 04/23/2013   Past Surgical History:  Procedure Laterality Date   BIOPSY  02/09/2020   Procedure: BIOPSY;  Surgeon: Lanelle Bal, DO;  [facility];  Service: Endoscopy;;   CHOLECYSTECTOMY     COLONOSCOPY WITH PROPOFOL N/A 02/09/2020   Procedure: COLONOSCOPY WITH PROPOFOL;  Surgeon: Lanelle Bal, DO;  [facility];  Service: Endoscopy;  Laterality: N/A;  8:00am   ESOPHAGOGASTRODUODENOSCOPY (EGD) WITH PROPOFOL N/A 02/09/2020   Procedure: ESOPHAGOGASTRODUODENOSCOPY (EGD) WITH PROPOFOL;  Surgeon: Lanelle Bal, DO;  [facility];  Service: Endoscopy;  Laterality: N/A;   TUBAL LIGATION     Patient Active Problem List   Diagnosis Date Noted   E. coli urinary tract infection 05/24/2021   Vaginal dryness 04/25/2021   Encounter for screening fecal occult blood testing 04/25/2021   Encounter for gynecological examination with Papanicolaou smear of cervix 04/25/2021   Iron deficiency anemia due to chronic blood loss 03/31/2021   Menorrhagia with irregular cycle 03/31/2021   Abdominal pain 04/20/2020   GERD (gastroesophageal reflux disease) 01/15/2020   Anemia 01/15/2020   Constipation 01/15/2020   Dysmenorrhea 12/25/2018    Dyspareunia in female 12/25/2018   Chest pain 07/21/2014   Dyspnea 07/21/2014   Palpitations 07/21/2014   Irregular menstrual bleeding 04/23/2013   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    PCP: [facility]  REFERRING PROVIDER: Bedelia Person, MD  REFERRING DIAG: [phone] (ICD-10-CM) - Herniated nucleus pulposus, L5-S1  Rationale for Evaluation and Treatment: Rehabilitation  THERAPY DIAG:  Other low back pain  Low back pain, unspecified back pain laterality, unspecified chronicity, unspecified whether sciatica present  Difficulty in walking, not elsewhere classified  ONSET DATE: Thanksgiving 2024  SUBJECTIVE:  SUBJECTIVE STATEMENT: PROGRESS NOTE 07/05/23: Currently reports of low back pain on the mid back and on the legs rated 5/10. Pain still radiates down to the feet (R>L). Patient has not been compliant with her HEP. Patient thinks that PT has helped her a little bit and reports that she's around 30% better. Patient thinks that she needs to continue with PT for her strength and pain.  Chronic pain for years but this was definitely worse/more.  No fall or injury that she can attribute it to; saw PCP; did an MRI; referred to Dr. Maisie Fus; got an epidural 05/21/23; was sore for a few weeks; maybe now a little better; just very uncomfortable in the low back; pain with twisting; sometimes sharp.  Pain with bending forward; disturbs sleep  PERTINENT HISTORY:  Her daughter was here for PT  PAIN:  Are you having pain? Yes: NPRS scale: 5/10; highest 9/10 Pain location: low back; middle Pain description: sharp, tight, aching and throbbing Aggravating  factors: bending, twisting Relieving factors: Aleve  PRECAUTIONS: None  Right side some N/T; has been going on a few weeks; both knees and feet hurt sometimes; tingling in both feet right much worse than left  WEIGHT BEARING RESTRICTIONS: No  FALLS:  Has patient fallen in last 6 months? No   PLOF: Independent  PATIENT GOALS: be able to walk, sit and move without pain  NEXT MD VISIT: April 7th, 2025  OBJECTIVE:  Note: Objective measures were completed at Evaluation unless otherwise noted.  DIAGNOSTIC FINDINGS:  IMPRESSION: 1. At L4-5 there is a mild disc bulge with a right foraminal/lateral disc protrusion in close proximity to the right L4 nerve root. Mild right foraminal stenosis. No left foraminal stenosis. Mild bilateral lateral recess narrowing. 2. At L5-S1 there is a mild disc bulge with a small central disc protrusion. Mild bilateral facet arthropathy. 3. No acute osseous injury of the lumbar spine.     Electronically Signed   By: Elige Ko M.D.   On: 04/25/2023 12:55  PATIENT SURVEYS:  Modified Oswestry 07/05/23: 23/50 = 46% from 29/50   COGNITION: Overall cognitive status: Within functional limits for tasks assessed     SENSATION: Reports of numbness both feet  POSTURE: decreased lumbar lordosis  PALPATION: Tender L4-S1 paraspinals  LUMBAR ROM:   AROM eval 07/05/23  Flexion Finger tips to mid shin Finger tips to the ankles  Extension 25% available 25% available*  Right lateral flexion    Left lateral flexion    Right rotation    Left rotation     (Blank rows = not tested) *repeated flex/ext in standing did not aggravate/relieve the pain  LOWER EXTREMITY ROM:     Active  Right eval Left eval  Hip flexion    Hip extension    Hip abduction    Hip adduction    Hip internal rotation    Hip external rotation    Knee flexion    Knee extension    Ankle dorsiflexion    Ankle plantarflexion    Ankle inversion    Ankle eversion     (Blank  rows = not tested)  LOWER EXTREMITY MMT:    MMT Right eval Left eval Right 07/05/23 Left 07/05/23  Hip flexion 4 5 4- 4  Hip extension 3- 3+ 3- 3-  Hip abduction   4- 4-  Hip adduction      Hip internal rotation      Hip external rotation      Knee flexion  4- 4+ 5 5  Knee extension 4+ 5 5 5   Ankle dorsiflexion 4 4 4  3+  Ankle plantarflexion   4 4  Ankle inversion      Ankle eversion       (Blank rows = not tested)  FUNCTIONAL TESTS:  5 times sit to stand: 07/05/23: 13.08 sec from 15.41 sec 2 minute walk test: 07/05/23: 547 ft  GAIT: Distance walked: 50 ft  in gym Assistive device utilized: None Level of assistance: Modified independence Comments: decreased gait speed  TREATMENT DATE:  07/05/23 Progress note (modified ODI, ROM, MMT, 5TSTS, POE x 5" x 10 Bridges with hip abd x YTB x 10 x 3" Education the improtance of HEP compliance - patient gave excellent understanding  07/03/23 Prone: Prone lying with moist heat x 5' Press ups x 10 Glute sets 5" x 10 Hamstring curls x 10 each Shoulder extension x 10 each Hip extension x 10 each Supine: LTR x 10 Abdominal bracing 5" hold x 10 Dead bug with physioball x 10 each Standing scap retraction RTB 2 x 10 Updated HEP      06/28/23 Prone: Prone lying with moist heat x 5' Glute sets 5" hold x 10 Press ups x 10 Supine: Abdominal bracing 5" x 10 Bridge x 15 LRT x 10 Dead bug x 10 Updated HEP  2023/07/20 Review of HEP and goals Prone lying with moist heat to low back x 5' Manual thoracic and lumbar mobilization grade 1 and 2 and STM x 8' Education on self trigger point massage with tennis ball Supine: Transverse abdominus 5" x 10 Bridge x 10 Update HEP  06/07/23 physical therapy evaluation and HEP instruction                                                                                                                                 PATIENT EDUCATION:  Education details: Patient educated on exam  findings, POC, scope of PT, HEP, and what to expect next visit. Person educated: Patient Education method: Explanation, Demonstration, and Handouts Education comprehension: verbalized understanding, returned demonstration, verbal cues required, and tactile cues required  HOME EXERCISE PROGRAM: 07/03/23 prone hip extension, shoulder extension  Access Code: [email or web address] URL: [email or web address] Date: 06/28/2023  -Supine Lower Trunk Rotation  - 1 x daily - 7 x weekly - 1 sets - 10 reps - Supine Dead Bug with Leg Extension  - 1 x daily - 7 x weekly - 1 sets - 10 reps   Access Code: [email or web address] Date: 20-Jul-2023 - Supine Transversus Abdominis Bracing - Hands on Stomach  - 1 x daily - 7 x weekly - 1 sets - 10 reps - 5 sec hold - Supine Bridge  - 1 x daily - 7 x weekly - 1 sets - 10 reps Access Code: [email or web address] URL: [email or web address] Date: 06/07/2023 Prepared by: [facility]  Exercises - Prone Press Up  - 4-5 x daily - 7 x  weekly - 1 sets - 10 reps - Standing Lumbar Extension with Counter  - 4-5 x daily - 7 x weekly - 1 sets - 10 reps  ASSESSMENT:  CLINICAL IMPRESSION: PROGRESS NOTE 07/05/23: Patient demonstrated continued improvements in function as indicated by positive changes in modified ODI, 5TSTS and ROM. However, these changes are not significant. In addition, patient still presents with deficits in strength and activity tolerance as indicated by . With this, skilled PT is still required to address the impairments and functional limitations listed below. Interventions today were geared towards centralization, core and hip strengthening. Tolerated all activities without worsening of symptoms (no radicular symptoms reported on today's session). Demonstrated appropriate levels of fatigue. Rest periods given. Provided slight amount of cueing to ensure correct execution of activity with good carry-over.      Eval:Patient is a 53 y.o. female who was seen today  for physical therapy evaluation and treatment for M51.27 (ICD-10-CM) - Herniated nucleus pulposus, L5-S1.  Patient demonstrates muscle weakness, reduced ROM, and fascial restrictions which are likely contributing to symptoms of pain and are negatively impacting patient ability to perform ADLs and functional mobility tasks. Patient will benefit from skilled physical therapy services to address these deficits to reduce pain and improve level of function with ADLs and functional mobility tasks.   OBJECTIVE IMPAIRMENTS: Abnormal gait, decreased activity tolerance, decreased mobility, decreased ROM, decreased strength, hypomobility, increased fascial restrictions, impaired perceived functional ability, postural dysfunction, and pain.   ACTIVITY LIMITATIONS: carrying, lifting, bending, sitting, standing, sleeping, and locomotion level  PARTICIPATION LIMITATIONS: meal prep, cleaning, laundry, driving, shopping, and community activity  REHAB POTENTIAL: Good    GOALS: Goals reviewed with patient? No  SHORT TERM GOALS: Target date: 06/21/2023  patient will be independent with initial HEP  Baseline: Goal status: In progress  2.  Patient will self report 50% improvement to improve tolerance for functional activity  Baseline:  Goal status: IN PROGRESS   LONG TERM GOALS: Target date: 08/02/2023  Patient will be independent in self management strategies to improve quality of life and functional outcomes.  Baseline:  Goal status: in progress  2.  Patient will self report 75% improvement to improve tolerance for functional activity  Baseline:  Goal status: in progress  3.  Patient will increase right leg MMT's to 4+ to 5/5 to allow navigation of steps without gait deviation or loss of balance   Baseline: see above Goal status: in progress  4.  Patient will improve 5 times sit to stand score from 15.41 sec to 12 sec or less to demonstrate improved functional mobility and increased leg  strength.    Baseline: 07/05/23: 13.08 sec Goal status: in progress  PLAN:  PT FREQUENCY: 2x/week  PT DURATION: 4 weeks  PLANNED INTERVENTIONS: 97164- PT Re-evaluation, 97110-Therapeutic exercises, 97530- Therapeutic activity, 97112- Neuromuscular re-education, 97535- Self Care, 96045- Manual therapy, [phone]- Gait training, Patient/Family education, Taping, Dry Needling, Joint mobilization, Joint manipulation, Spinal manipulation, Spinal mobilization,   PLAN FOR NEXT SESSION:  Continue POC and may progress as tolerated with emphasis on extension activities, core and postural strengthening  Iantha Fallen L. Jaila Schellhorn, PT, DPT, OCS Board-Certified Clinical Specialist in Orthopedic PT PT Compact Privilege # ([place]): [phone] T 1:40 PM, 07/05/23

## 2023-07-10 ENCOUNTER — Ambulatory Visit (HOSPITAL_COMMUNITY): Payer: Commercial Managed Care - PPO

## 2023-07-10 DIAGNOSIS — R262 Difficulty in walking, not elsewhere classified: Secondary | ICD-10-CM

## 2023-07-10 DIAGNOSIS — M5459 Other low back pain: Secondary | ICD-10-CM | POA: Diagnosis not present

## 2023-07-10 DIAGNOSIS — M545 Low back pain, unspecified: Secondary | ICD-10-CM

## 2023-07-10 NOTE — Therapy (Signed)
 OUTPATIENT PHYSICAL THERAPY THORACOLUMBAR TREATMENT   Patient Name: AARA JACQUOT MRN: [id number] DOB:[birth year], 53 y.o., female Today's Date: 07/10/2023  END OF SESSION:  PT End of Session - 07/10/23 1437     Visit Number 6    Number of Visits 13    Date for PT Re-Evaluation 08/02/23    Authorization Type U[employer]    PT Start Time 1436    PT Stop Time 1515    PT Time Calculation (min) 39 min    Activity Tolerance Patient tolerated treatment well    Behavior During Therapy [facility] for tasks assessed/performed            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   Hyperlipidemia 2014   rx'd zocor; didn't do   Irregular menstrual bleeding 04/23/2013   Past Surgical History:  Procedure Laterality Date   BIOPSY  02/09/2020   Procedure: BIOPSY;  Surgeon: Lanelle Bal, DO;  [facility];  Service: Endoscopy;;   CHOLECYSTECTOMY     COLONOSCOPY WITH PROPOFOL N/A 02/09/2020   Procedure: COLONOSCOPY WITH PROPOFOL;  Surgeon: Lanelle Bal, DO;  [facility];  Service: Endoscopy;  Laterality: N/A;  8:00am   ESOPHAGOGASTRODUODENOSCOPY (EGD) WITH PROPOFOL N/A 02/09/2020   Procedure: ESOPHAGOGASTRODUODENOSCOPY (EGD) WITH PROPOFOL;  Surgeon: Lanelle Bal, DO;  [facility];  Service: Endoscopy;  Laterality: N/A;   TUBAL LIGATION     Patient Active Problem List   Diagnosis Date Noted   E. coli urinary tract infection 05/24/2021   Vaginal dryness 04/25/2021   Encounter for screening fecal occult blood testing 04/25/2021   Encounter for gynecological examination with Papanicolaou smear of cervix 04/25/2021   Iron deficiency anemia due to chronic blood loss 03/31/2021   Menorrhagia with irregular cycle 03/31/2021   Abdominal pain 04/20/2020   GERD (gastroesophageal reflux disease) 01/15/2020   Anemia 01/15/2020   Constipation 01/15/2020   Dysmenorrhea 12/25/2018    Dyspareunia in female 12/25/2018   Chest pain 07/21/2014   Dyspnea 07/21/2014   Palpitations 07/21/2014   Irregular menstrual bleeding 04/23/2013   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    [facility]P: [facility]  REFERRING PROVIDER: Bedelia Person, MD  REFERRING DIAG: [phone] (ICD-10-CM) - Herniated nucleus pulposus, L5-S1  Rationale for Evaluation and Treatment: Rehabilitation  THERAPY DIAG:  Other low back pain  Low back pain, unspecified back pain laterality, unspecified chronicity, unspecified whether sciatica present  Difficulty in walking, not elsewhere classified  ONSET DATE: Thanksgiving 2024  SUBJECTIVE:  SUBJECTIVE STATEMENT: 8/10 pain today in low back; patient frustrated with not understanding what is flaring her up.    Chronic pain for years but this was definitely worse/more.  No fall or injury that she can attribute it to; saw PCP; did an MRI; referred to Dr. Maisie Fus; got an epidural 05/21/23; was sore for a few weeks; maybe now a little better; just very uncomfortable in the low back; pain with twisting; sometimes sharp.  Pain with bending forward; disturbs sleep  PERTINENT HISTORY:  Her daughter was here for PT  PAIN:  Are you having pain? Yes: NPRS scale: 5/10; highest 9/10 Pain location: low back; middle Pain description: sharp, tight, aching and throbbing Aggravating factors: bending, twisting Relieving factors: Aleve  PRECAUTIONS: None  Right side some N/T; has been going on a few weeks; both knees and feet hurt sometimes; tingling in both feet right much worse than left  WEIGHT BEARING RESTRICTIONS: No  FALLS:  Has  patient fallen in last 6 months? No   PLOF: Independent  PATIENT GOALS: be able to walk, sit and move without pain  NEXT MD VISIT: April 7th, 2025  OBJECTIVE:  Note: Objective measures were completed at Evaluation unless otherwise noted.  DIAGNOSTIC FINDINGS:  IMPRESSION: 1. At L4-5 there is a mild disc bulge with a right foraminal/lateral disc protrusion in close proximity to the right L4 nerve root. Mild right foraminal stenosis. No left foraminal stenosis. Mild bilateral lateral recess narrowing. 2. At L5-S1 there is a mild disc bulge with a small central disc protrusion. Mild bilateral facet arthropathy. 3. No acute osseous injury of the lumbar spine.     Electronically Signed   By: Elige Ko M.D.   On: 04/25/2023 12:55  PATIENT SURVEYS:  Modified Oswestry 07/05/23: 23/50 = 46% from 29/50   COGNITION: Overall cognitive status: Within functional limits for tasks assessed     SENSATION: Reports of numbness both feet  POSTURE: decreased lumbar lordosis  PALPATION: Tender L4-S1 paraspinals  LUMBAR ROM:   AROM eval 07/05/23  Flexion Finger tips to mid shin Finger tips to the ankles  Extension 25% available 25% available*  Right lateral flexion    Left lateral flexion    Right rotation    Left rotation     (Blank rows = not tested) *repeated flex/ext in standing did not aggravate/relieve the pain  LOWER EXTREMITY ROM:     Active  Right eval Left eval  Hip flexion    Hip extension    Hip abduction    Hip adduction    Hip internal rotation    Hip external rotation    Knee flexion    Knee extension    Ankle dorsiflexion    Ankle plantarflexion    Ankle inversion    Ankle eversion     (Blank rows = not tested)  LOWER EXTREMITY MMT:    MMT Right eval Left eval Right 07/05/23 Left 07/05/23  Hip flexion 4 5 4- 4  Hip extension 3- 3+ 3- 3-  Hip abduction   4- 4-  Hip adduction      Hip internal rotation      Hip external rotation      Knee  flexion 4- 4+ 5 5  Knee extension 4+ 5 5 5   Ankle dorsiflexion 4 4 4  3+  Ankle plantarflexion   4 4  Ankle inversion      Ankle eversion       (Blank rows = not tested)  FUNCTIONAL  TESTS:  5 times sit to stand: 07/05/23: 13.08 sec from 15.41 sec 2 minute walk test: 07/05/23: 547 ft  GAIT: Distance walked: 50 ft  in gym Assistive device utilized: None Level of assistance: Modified independence Comments: decreased gait speed  TREATMENT DATE:  07/10/23 Prone lying with moist heat x 5' Prone press ups x 10 Seated thoracic extension x 10 Education on good sitting posture with towel roll Nustep seat 9 x 5' for mobility Standing hip vectors 2" x 5 each Scapular retractions RTB 2 x 10  07/05/23 Progress note (modified ODI, ROM, MMT, 5TSTS, POE x 5" x 10 Bridges with hip abd x YTB x 10 x 3" Education the improtance of HEP compliance - patient gave excellent understanding  07/03/23 Prone: Prone lying with moist heat x 5' Press ups x 10 Glute sets 5" x 10 Hamstring curls x 10 each Shoulder extension x 10 each Hip extension x 10 each Supine: LTR x 10 Abdominal bracing 5" hold x 10 Dead bug with physioball x 10 each Standing scap retraction RTB 2 x 10 Updated HEP      06/28/23 Prone: Prone lying with moist heat x 5' Glute sets 5" hold x 10 Press ups x 10 Supine: Abdominal bracing 5" x 10 Bridge x 15 LRT x 10 Dead bug x 10 Updated HEP  July 27, 2023 Review of HEP and goals Prone lying with moist heat to low back x 5' Manual thoracic and lumbar mobilization grade 1 and 2 and STM x 8' Education on self trigger point massage with tennis ball Supine: Transverse abdominus 5" x 10 Bridge x 10 Update HEP  06/07/23 physical therapy evaluation and HEP instruction                                                                                                                                 PATIENT EDUCATION:  Education details: Patient educated on exam findings, POC,  scope of PT, HEP, and what to expect next visit. Person educated: Patient Education method: Explanation, Demonstration, and Handouts Education comprehension: verbalized understanding, returned demonstration, verbal cues required, and tactile cues required  HOME EXERCISE PROGRAM: 07/03/23 prone hip extension, shoulder extension  Access Code: [email or web address] URL: [email or web address] Date: 06/28/2023  -Supine Lower Trunk Rotation  - 1 x daily - 7 x weekly - 1 sets - 10 reps - Supine Dead Bug with Leg Extension  - 1 x daily - 7 x weekly - 1 sets - 10 reps   Access Code: [email or web address] Date: 2023-07-27 - Supine Transversus Abdominis Bracing - Hands on Stomach  - 1 x daily - 7 x weekly - 1 sets - 10 reps - 5 sec hold - Supine Bridge  - 1 x daily - 7 x weekly - 1 sets - 10 reps Access Code: [email or web address] URL: [email or web address] Date: 06/07/2023 Prepared by: [facility]  Exercises - Prone Press Up  - 4-5 x daily - 7 x weekly -  1 sets - 10 reps - Standing Lumbar Extension with Counter  - 4-5 x daily - 7 x weekly - 1 sets - 10 reps  ASSESSMENT:  CLINICAL IMPRESSION: Today's session with continued focus on core strengthening; progressing to standing strengthening today and added thoracic extension over chair today as patient continues to be very limited with spinal extension.  Educated patient at length regarding good sitting posture and use of lumbar roll to cue herself to sit up straight.  Updated HEP.  Patient will benefit from continued skilled therapy services to address deficits and promote return to optimal function.         Eval:Patient is a 53 y.o. female who was seen today for physical therapy evaluation and treatment for M51.27 (ICD-10-CM) - Herniated nucleus pulposus, L5-S1.  Patient demonstrates muscle weakness, reduced ROM, and fascial restrictions which are likely contributing to symptoms of pain and are negatively impacting patient ability to perform ADLs and  functional mobility tasks. Patient will benefit from skilled physical therapy services to address these deficits to reduce pain and improve level of function with ADLs and functional mobility tasks.   OBJECTIVE IMPAIRMENTS: Abnormal gait, decreased activity tolerance, decreased mobility, decreased ROM, decreased strength, hypomobility, increased fascial restrictions, impaired perceived functional ability, postural dysfunction, and pain.   ACTIVITY LIMITATIONS: carrying, lifting, bending, sitting, standing, sleeping, and locomotion level  PARTICIPATION LIMITATIONS: meal prep, cleaning, laundry, driving, shopping, and community activity  REHAB POTENTIAL: Good    GOALS: Goals reviewed with patient? No  SHORT TERM GOALS: Target date: 06/21/2023  patient will be independent with initial HEP  Baseline: Goal status: In progress  2.  Patient will self report 50% improvement to improve tolerance for functional activity  Baseline:  Goal status: IN PROGRESS   LONG TERM GOALS: Target date: 08/02/2023  Patient will be independent in self management strategies to improve quality of life and functional outcomes.  Baseline:  Goal status: in progress  2.  Patient will self report 75% improvement to improve tolerance for functional activity  Baseline:  Goal status: in progress  3.  Patient will increase right leg MMT's to 4+ to 5/5 to allow navigation of steps without gait deviation or loss of balance   Baseline: see above Goal status: in progress  4.  Patient will improve 5 times sit to stand score from 15.41 sec to 12 sec or less to demonstrate improved functional mobility and increased leg strength.    Baseline: 07/05/23: 13.08 sec Goal status: in progress  PLAN:  PT FREQUENCY: 2x/week  PT DURATION: 4 weeks  PLANNED INTERVENTIONS: 97164- PT Re-evaluation, 97110-Therapeutic exercises, 97530- Therapeutic activity, 97112- Neuromuscular re-education, 97535- Self Care, 16109-  Manual therapy, [phone]- Gait training, Patient/Family education, Taping, Dry Needling, Joint mobilization, Joint manipulation, Spinal manipulation, Spinal mobilization,   PLAN FOR NEXT SESSION:  Continue POC and may progress as tolerated with emphasis on extension activities, core and postural strengthening  3:24 PM, 07/10/23 Mariene Dickerman Small Lechelle Wrigley MPT [facility] [place] [phone] [phone]

## 2023-07-12 ENCOUNTER — Encounter (HOSPITAL_COMMUNITY): Payer: Commercial Managed Care - PPO

## 2023-07-19 ENCOUNTER — Ambulatory Visit (HOSPITAL_COMMUNITY): Payer: Commercial Managed Care - PPO | Attending: Neurosurgery

## 2023-07-19 DIAGNOSIS — R262 Difficulty in walking, not elsewhere classified: Secondary | ICD-10-CM | POA: Diagnosis present

## 2023-07-19 DIAGNOSIS — M545 Low back pain, unspecified: Secondary | ICD-10-CM | POA: Insufficient documentation

## 2023-07-19 DIAGNOSIS — M5459 Other low back pain: Secondary | ICD-10-CM | POA: Diagnosis present

## 2023-07-19 NOTE — Patient Instructions (Signed)

## 2023-07-19 NOTE — Therapy (Signed)
 OUTPATIENT PHYSICAL THERAPY THORACOLUMBAR TREATMENT   Patient Name: Raven Smith MRN: [id number] DOB:1970/11/13, 53 y.o., female Today's Date: 07/19/2023  END OF SESSION:  PT End of Session - 07/19/23 1151     Visit Number 7    Number of Visits 13    Date for PT Re-Evaluation 08/02/23    Authorization Type UHC    PT Start Time 1146    PT Stop Time 1226    PT Time Calculation (min) 40 min    Activity Tolerance Patient tolerated treatment well    Behavior During Therapy [facility] for tasks assessed/performed            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   Hyperlipidemia 2014   rx'd zocor; didn't do   Irregular menstrual bleeding 04/23/2013   Past Surgical History:  Procedure Laterality Date   BIOPSY  02/09/2020   Procedure: BIOPSY;  Surgeon: Lanelle Bal, DO;  [facility];  Service: Endoscopy;;   CHOLECYSTECTOMY     COLONOSCOPY WITH PROPOFOL N/A 02/09/2020   Procedure: COLONOSCOPY WITH PROPOFOL;  Surgeon: Lanelle Bal, DO;  [facility];  Service: Endoscopy;  Laterality: N/A;  8:00am   ESOPHAGOGASTRODUODENOSCOPY (EGD) WITH PROPOFOL N/A 02/09/2020   Procedure: ESOPHAGOGASTRODUODENOSCOPY (EGD) WITH PROPOFOL;  Surgeon: Lanelle Bal, DO;  [facility];  Service: Endoscopy;  Laterality: N/A;   TUBAL LIGATION     Patient Active Problem List   Diagnosis Date Noted   E. coli urinary tract infection 05/24/2021   Vaginal dryness 04/25/2021   Encounter for screening fecal occult blood testing 04/25/2021   Encounter for gynecological examination with Papanicolaou smear of cervix 04/25/2021   Iron deficiency anemia due to chronic blood loss 03/31/2021   Menorrhagia with irregular cycle 03/31/2021   Abdominal pain 04/20/2020   GERD (gastroesophageal reflux disease) 01/15/2020   Anemia 01/15/2020   Constipation 01/15/2020   Dysmenorrhea 12/25/2018    Dyspareunia in female 12/25/2018   Chest pain 07/21/2014   Dyspnea 07/21/2014   Palpitations 07/21/2014   Irregular menstrual bleeding 04/23/2013   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    [facility]P: [facility]  REFERRING PROVIDER: Bedelia Person, MD  REFERRING DIAG: [phone] (ICD-10-CM) - Herniated nucleus pulposus, L5-S1  Rationale for Evaluation and Treatment: Rehabilitation  THERAPY DIAG:  Other low back pain  Low back pain, unspecified back pain laterality, unspecified chronicity, unspecified whether sciatica present  Difficulty in walking, not elsewhere classified  ONSET DATE: Thanksgiving 2024  SUBJECTIVE:  SUBJECTIVE STATEMENT: 6-7/10 pain today; haven't been sleeping well for a while    Chronic pain for years but this was definitely worse/more.  No fall or injury that she can attribute it to; saw PCP; did an MRI; referred to Dr. Maisie Fus; got an epidural 05/21/23; was sore for a few weeks; maybe now a little better; just very uncomfortable in the low back; pain with twisting; sometimes sharp.  Pain with bending forward; disturbs sleep  PERTINENT HISTORY:  Her daughter was here for PT  PAIN:  Are you having pain? Yes: NPRS scale: 5/10; highest 9/10 Pain location: low back; middle Pain description: sharp, tight, aching and throbbing Aggravating factors: bending, twisting Relieving factors: Aleve  PRECAUTIONS: None  Right side some N/T; has been going on a few weeks; both knees and feet hurt sometimes; tingling in both feet right much worse than left  WEIGHT BEARING RESTRICTIONS: No  FALLS:  Has patient fallen in last 6 months?  No   PLOF: Independent  PATIENT GOALS: be able to walk, sit and move without pain  NEXT MD VISIT: April 7th, 2025  OBJECTIVE:  Note: Objective measures were completed at Evaluation unless otherwise noted.  DIAGNOSTIC FINDINGS:  IMPRESSION: 1. At L4-5 there is a mild disc bulge with a right foraminal/lateral disc protrusion in close proximity to the right L4 nerve root. Mild right foraminal stenosis. No left foraminal stenosis. Mild bilateral lateral recess narrowing. 2. At L5-S1 there is a mild disc bulge with a small central disc protrusion. Mild bilateral facet arthropathy. 3. No acute osseous injury of the lumbar spine.     Electronically Signed   By: Elige Ko M.D.   On: 04/25/2023 12:55  PATIENT SURVEYS:  Modified Oswestry 07/05/23: 23/50 = 46% from 29/50   COGNITION: Overall cognitive status: Within functional limits for tasks assessed     SENSATION: Reports of numbness both feet  POSTURE: decreased lumbar lordosis  PALPATION: Tender L4-S1 paraspinals  LUMBAR ROM:   AROM eval 07/05/23  Flexion Finger tips to mid shin Finger tips to the ankles  Extension 25% available 25% available*  Right lateral flexion    Left lateral flexion    Right rotation    Left rotation     (Blank rows = not tested) *repeated flex/ext in standing did not aggravate/relieve the pain  LOWER EXTREMITY ROM:     Active  Right eval Left eval  Hip flexion    Hip extension    Hip abduction    Hip adduction    Hip internal rotation    Hip external rotation    Knee flexion    Knee extension    Ankle dorsiflexion    Ankle plantarflexion    Ankle inversion    Ankle eversion     (Blank rows = not tested)  LOWER EXTREMITY MMT:    MMT Right eval Left eval Right 07/05/23 Left 07/05/23  Hip flexion 4 5 4- 4  Hip extension 3- 3+ 3- 3-  Hip abduction   4- 4-  Hip adduction      Hip internal rotation      Hip external rotation      Knee flexion 4- 4+ 5 5  Knee extension  4+ 5 5 5   Ankle dorsiflexion 4 4 4  3+  Ankle plantarflexion   4 4  Ankle inversion      Ankle eversion       (Blank rows = not tested)  FUNCTIONAL TESTS:  5 times sit to  stand: 07/05/23: 13.08 sec from 15.41 sec 2 minute walk test: 07/05/23: 547 ft  GAIT: Distance walked: 50 ft  in gym Assistive device utilized: None Level of assistance: Modified independence Comments: decreased gait speed  TREATMENT DATE:  07/19/23 Prone lying with moist heat x 5' Manual thoracic and lumbar mobilizations grad 2 and soft tissue massage to decrease pain and muscle spasm x 12' Discussion of dry needling and issued instructions  Prone press up x 10 Glute sets 5" hold x 10 Seated  Thoracic extension x 10 Thoracic and lumbar rotation x 10    07/10/23 Prone lying with moist heat x 5' Prone press ups x 10 Seated thoracic extension x 10 Education on good sitting posture with towel roll Nustep seat 9 x 5' for mobility Standing hip vectors 2" x 5 each Scapular retractions RTB 2 x 10  07/05/23 Progress note (modified ODI, ROM, MMT, 5TSTS, POE x 5" x 10 Bridges with hip abd x YTB x 10 x 3" Education the improtance of HEP compliance - patient gave excellent understanding  07/03/23 Prone: Prone lying with moist heat x 5' Press ups x 10 Glute sets 5" x 10 Hamstring curls x 10 each Shoulder extension x 10 each Hip extension x 10 each Supine: LTR x 10 Abdominal bracing 5" hold x 10 Dead bug with physioball x 10 each Standing scap retraction RTB 2 x 10 Updated HEP      06/28/23 Prone: Prone lying with moist heat x 5' Glute sets 5" hold x 10 Press ups x 10 Supine: Abdominal bracing 5" x 10 Bridge x 15 LRT x 10 Dead bug x 10 Updated HEP  July 06, 2023 Review of HEP and goals Prone lying with moist heat to low back x 5' Manual thoracic and lumbar mobilization grade 1 and 2 and STM x 8' Education on self trigger point massage with tennis ball Supine: Transverse abdominus 5" x  10 Bridge x 10 Update HEP  06/07/23 physical therapy evaluation and HEP instruction                                                                                                                                 PATIENT EDUCATION:  Education details: Patient educated on exam findings, POC, scope of PT, HEP, and what to expect next visit. Person educated: Patient Education method: Explanation, Demonstration, and Handouts Education comprehension: verbalized understanding, returned demonstration, verbal cues required, and tactile cues required  HOME EXERCISE PROGRAM: 07/03/23 prone hip extension, shoulder extension  Access Code: [email or web address] URL: [email or web address] Date: 06/28/2023  -Supine Lower Trunk Rotation  - 1 x daily - 7 x weekly - 1 sets - 10 reps - Supine Dead Bug with Leg Extension  - 1 x daily - 7 x weekly - 1 sets - 10 reps   Access Code: [email or web address] Date: 07/06/23 - Supine Transversus Abdominis Bracing - Hands on Stomach  - 1 x daily - 7 x weekly -  1 sets - 10 reps - 5 sec hold - Supine Bridge  - 1 x daily - 7 x weekly - 1 sets - 10 reps Access Code: [email or web address] URL: [email or web address] Date: 06/07/2023 Prepared by: [facility]  Exercises - Prone Press Up  - 4-5 x daily - 7 x weekly - 1 sets - 10 reps - Standing Lumbar Extension with Counter  - 4-5 x daily - 7 x weekly - 1 sets - 10 reps  ASSESSMENT:  CLINICAL IMPRESSION: Today's session with continued focus on core strengthening and lumbar mobility.  She presents with hypomobility on right side of thoracic and lumbar spine versus left with mobilizations and palpable tightness of paraspinals and rhomboids right side more than left.   Patient will benefit from continued skilled therapy services to address deficits and promote return to optimal function.         Eval:Patient is a 53 y.o. female who was seen today for physical therapy evaluation and treatment for M51.27 (ICD-10-CM) -  Herniated nucleus pulposus, L5-S1.  Patient demonstrates muscle weakness, reduced ROM, and fascial restrictions which are likely contributing to symptoms of pain and are negatively impacting patient ability to perform ADLs and functional mobility tasks. Patient will benefit from skilled physical therapy services to address these deficits to reduce pain and improve level of function with ADLs and functional mobility tasks.   OBJECTIVE IMPAIRMENTS: Abnormal gait, decreased activity tolerance, decreased mobility, decreased ROM, decreased strength, hypomobility, increased fascial restrictions, impaired perceived functional ability, postural dysfunction, and pain.   ACTIVITY LIMITATIONS: carrying, lifting, bending, sitting, standing, sleeping, and locomotion level  PARTICIPATION LIMITATIONS: meal prep, cleaning, laundry, driving, shopping, and community activity  REHAB POTENTIAL: Good    GOALS: Goals reviewed with patient? No  SHORT TERM GOALS: Target date: 06/21/2023  patient will be independent with initial HEP  Baseline: Goal status: In progress  2.  Patient will self report 50% improvement to improve tolerance for functional activity  Baseline:  Goal status: IN PROGRESS   LONG TERM GOALS: Target date: 08/02/2023  Patient will be independent in self management strategies to improve quality of life and functional outcomes.  Baseline:  Goal status: in progress  2.  Patient will self report 75% improvement to improve tolerance for functional activity  Baseline:  Goal status: in progress  3.  Patient will increase right leg MMT's to 4+ to 5/5 to allow navigation of steps without gait deviation or loss of balance   Baseline: see above Goal status: in progress  4.  Patient will improve 5 times sit to stand score from 15.41 sec to 12 sec or less to demonstrate improved functional mobility and increased leg strength.    Baseline: 07/05/23: 13.08 sec Goal status: in  progress  PLAN:  PT FREQUENCY: 2x/week  PT DURATION: 4 weeks  PLANNED INTERVENTIONS: 97164- PT Re-evaluation, 97110-Therapeutic exercises, 97530- Therapeutic activity, 97112- Neuromuscular re-education, 97535- Self Care, 15176- Manual therapy, [phone]- Gait training, Patient/Family education, Taping, Dry Needling, Joint mobilization, Joint manipulation, Spinal manipulation, Spinal mobilization,   PLAN FOR NEXT SESSION:  Continue POC and may progress as tolerated with emphasis on extension activities, core and postural strengthening  1:46 PM, 07/19/23 Finola Rosal Small Gaddiel Cullens MPT [facility] [place] [phone] Ph:[phone]

## 2023-07-24 ENCOUNTER — Ambulatory Visit (HOSPITAL_COMMUNITY): Payer: Commercial Managed Care - PPO

## 2023-07-24 DIAGNOSIS — M5459 Other low back pain: Secondary | ICD-10-CM | POA: Diagnosis not present

## 2023-07-24 DIAGNOSIS — R262 Difficulty in walking, not elsewhere classified: Secondary | ICD-10-CM

## 2023-07-24 DIAGNOSIS — M545 Low back pain, unspecified: Secondary | ICD-10-CM

## 2023-07-24 NOTE — Therapy (Signed)
 OUTPATIENT PHYSICAL THERAPY THORACOLUMBAR TREATMENT   Patient Name: ANAYI BRICCO MRN: [id number] DOB:1971/02/14, 53 y.o., female Today's Date: 07/24/2023  END OF SESSION:  PT End of Session - 07/24/23 1349     Visit Number 8    Number of Visits 13    Date for PT Re-Evaluation 08/02/23    Authorization Type [employer]    PT Start Time 1350    PT Stop Time 1430    PT Time Calculation (min) 40 min    Activity Tolerance Patient tolerated treatment well    Behavior During Therapy [facility] for tasks assessed/performed            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   Hyperlipidemia 2014   rx'd zocor; didn't do   Irregular menstrual bleeding 04/23/2013   Past Surgical History:  Procedure Laterality Date   BIOPSY  02/09/2020   Procedure: BIOPSY;  Surgeon: Lanelle Bal, DO;  [facility]: [facility];  Service: Endoscopy;;   CHOLECYSTECTOMY     COLONOSCOPY WITH PROPOFOL N/A 02/09/2020   Procedure: COLONOSCOPY WITH PROPOFOL;  Surgeon: Lanelle Bal, DO;  Location: [facility];  Service: Endoscopy;  Laterality: N/A;  8:00am   ESOPHAGOGASTRODUODENOSCOPY (EGD) WITH PROPOFOL N/A 02/09/2020   Procedure: ESOPHAGOGASTRODUODENOSCOPY (EGD) WITH PROPOFOL;  Surgeon: Lanelle Bal, DO;  Location: [facility];  Service: Endoscopy;  Laterality: N/A;   TUBAL LIGATION     Patient Active Problem List   Diagnosis Date Noted   E. coli urinary tract infection 05/24/2021   Vaginal dryness 04/25/2021   Encounter for screening fecal occult blood testing 04/25/2021   Encounter for gynecological examination with Papanicolaou smear of cervix 04/25/2021   Iron deficiency anemia due to chronic blood loss 03/31/2021   Menorrhagia with irregular cycle 03/31/2021   Abdominal pain 04/20/2020   GERD (gastroesophageal reflux disease) 01/15/2020   Anemia 01/15/2020   Constipation 01/15/2020   Dysmenorrhea 12/25/2018    Dyspareunia in female 12/25/2018   Chest pain 07/21/2014   Dyspnea 07/21/2014   Palpitations 07/21/2014   Irregular menstrual bleeding 04/23/2013   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    PCP: [facility]  REFERRING PROVIDER: Bedelia Person, MD  REFERRING DIAG: [id number] (ICD-10-CM) - Herniated nucleus pulposus, L5-S1  Rationale for Evaluation and Treatment: Rehabilitation  THERAPY DIAG:  Other low back pain  Low back pain, unspecified back pain laterality, unspecified chronicity, unspecified whether sciatica present  Difficulty in walking, not elsewhere classified  ONSET DATE: Thanksgiving 2024  SUBJECTIVE:  SUBJECTIVE STATEMENT: 5/10 pain today; still "good days and bad"    Chronic pain for years but this was definitely worse/more.  No fall or injury that she can attribute it to; saw PCP; did an MRI; referred to Dr. Maisie Fus; got an epidural 05/21/23; was sore for a few weeks; maybe now a little better; just very uncomfortable in the low back; pain with twisting; sometimes sharp.  Pain with bending forward; disturbs sleep  PERTINENT HISTORY:  Her daughter was here for PT  PAIN:  Are you having pain? Yes: NPRS scale: 5/10; highest 9/10 Pain location: low back; middle Pain description: sharp, tight, aching and throbbing Aggravating factors: bending, twisting Relieving factors: Aleve  PRECAUTIONS: None  Right side some N/T; has been going on a few weeks; both knees and feet hurt sometimes; tingling in both feet right much worse than left  WEIGHT BEARING RESTRICTIONS: No  FALLS:  Has patient fallen in last 6 months? No   PLOF:  Independent  PATIENT GOALS: be able to walk, sit and move without pain  NEXT MD VISIT: April 7th, 2025  OBJECTIVE:  Note: Objective measures were completed at Evaluation unless otherwise noted.  DIAGNOSTIC FINDINGS:  IMPRESSION: 1. At L4-5 there is a mild disc bulge with a right foraminal/lateral disc protrusion in close proximity to the right L4 nerve root. Mild right foraminal stenosis. No left foraminal stenosis. Mild bilateral lateral recess narrowing. 2. At L5-S1 there is a mild disc bulge with a small central disc protrusion. Mild bilateral facet arthropathy. 3. No acute osseous injury of the lumbar spine.     Electronically Signed   By: Elige Ko M.D.   On: 04/25/2023 12:55  PATIENT SURVEYS:  Modified Oswestry 07/05/23: 23/50 = 46% from 29/50   COGNITION: Overall cognitive status: Within functional limits for tasks assessed     SENSATION: Reports of numbness both feet  POSTURE: decreased lumbar lordosis  PALPATION: Tender L4-S1 paraspinals  LUMBAR ROM:   AROM eval 07/05/23  Flexion Finger tips to mid shin Finger tips to the ankles  Extension 25% available 25% available*  Right lateral flexion    Left lateral flexion    Right rotation    Left rotation     (Blank rows = not tested) *repeated flex/ext in standing did not aggravate/relieve the pain  LOWER EXTREMITY ROM:     Active  Right eval Left eval  Hip flexion    Hip extension    Hip abduction    Hip adduction    Hip internal rotation    Hip external rotation    Knee flexion    Knee extension    Ankle dorsiflexion    Ankle plantarflexion    Ankle inversion    Ankle eversion     (Blank rows = not tested)  LOWER EXTREMITY MMT:    MMT Right eval Left eval Right 07/05/23 Left 07/05/23  Hip flexion 4 5 4- 4  Hip extension 3- 3+ 3- 3-  Hip abduction   4- 4-  Hip adduction      Hip internal rotation      Hip external rotation      Knee flexion 4- 4+ 5 5  Knee extension 4+ 5 5 5    Ankle dorsiflexion 4 4 4  3+  Ankle plantarflexion   4 4  Ankle inversion      Ankle eversion       (Blank rows = not tested)  FUNCTIONAL TESTS:  5 times sit to stand: 07/05/23:  13.08 sec from 15.41 sec 2 minute walk test: 07/05/23: 547 ft  GAIT: Distance walked: 50 ft  in gym Assistive device utilized: None Level of assistance: Modified independence Comments: decreased gait speed  TREATMENT DATE:  07/24/23 Supine: Moist heat to low back x 5'  LTR x 10 each Left sidelying with bolster x 1' sidelying with soft bolster with rotation "open book" x 10 each Standing side stretch overhead x 10 Seated Ball roll out for flexion x 10 Updated HEP   07/19/23 Prone lying with moist heat x 5' Manual thoracic and lumbar mobilizations grad 2 and soft tissue massage to decrease pain and muscle spasm x 12' Discussion of dry needling and issued instructions  Prone press up x 10 Glute sets 5" hold x 10 Seated  Thoracic extension x 10 Thoracic and lumbar rotation x 10    07/10/23 Prone lying with moist heat x 5' Prone press ups x 10 Seated thoracic extension x 10 Education on good sitting posture with towel roll Nustep seat 9 x 5' for mobility Standing hip vectors 2" x 5 each Scapular retractions RTB 2 x 10  07/05/23 Progress note (modified ODI, ROM, MMT, 5TSTS, POE x 5" x 10 Bridges with hip abd x YTB x 10 x 3" Education the improtance of HEP compliance - patient gave excellent understanding  07/03/23 Prone: Prone lying with moist heat x 5' Press ups x 10 Glute sets 5" x 10 Hamstring curls x 10 each Shoulder extension x 10 each Hip extension x 10 each Supine: LTR x 10 Abdominal bracing 5" hold x 10 Dead bug with physioball x 10 each Standing scap retraction RTB 2 x 10 Updated HEP      06/28/23 Prone: Prone lying with moist heat x 5' Glute sets 5" hold x 10 Press ups x 10 Supine: Abdominal bracing 5" x 10 Bridge x 15 LRT x 10 Dead bug x 10 Updated  HEP  07-25-2023 Review of HEP and goals Prone lying with moist heat to low back x 5' Manual thoracic and lumbar mobilization grade 1 and 2 and STM x 8' Education on self trigger point massage with tennis ball Supine: Transverse abdominus 5" x 10 Bridge x 10 Update HEP  06/07/23 physical therapy evaluation and HEP instruction                                                                                                                                 PATIENT EDUCATION:  Education details: Patient educated on exam findings, POC, scope of PT, HEP, and what to expect next visit. Person educated: Patient Education method: Explanation, Demonstration, and Handouts Education comprehension: verbalized understanding, returned demonstration, verbal cues required, and tactile cues required  HOME EXERCISE PROGRAM: 07/03/23 prone hip extension, shoulder extension  Access Code: [email or web address] URL: [email or web address] Date: 06/28/2023  -Supine Lower Trunk Rotation  - 1 x daily - 7 x weekly - 1 sets - 10 reps -  Supine Dead Bug with Leg Extension  - 1 x daily - 7 x weekly - 1 sets - 10 reps   Access Code: [email or web address] Date: 06/27/2023 - Supine Transversus Abdominis Bracing - Hands on Stomach  - 1 x daily - 7 x weekly - 1 sets - 10 reps - 5 sec hold - Supine Bridge  - 1 x daily - 7 x weekly - 1 sets - 10 reps Access Code: [email or web address] URL: [email or web address] Date: 06/07/2023 Prepared by: [facility]  Exercises - Prone Press Up  - 4-5 x daily - 7 x weekly - 1 sets - 10 reps - Standing Lumbar Extension with Counter  - 4-5 x daily - 7 x weekly - 1 sets - 10 reps  ASSESSMENT:  CLINICAL IMPRESSION: Today's session with continued focus on core strengthening and lumbar mobility. Defers dry needling today.  Trial of side lying rotation; reports feeling "tighter" in right side lying with open book stretch.  Trial of flexion based seated stretch with ball.  Patient will benefit from  continued skilled therapy services to address deficits and promote return to optimal function.         Eval:Patient is a 53 y.o. female who was seen today for physical therapy evaluation and treatment for M51.27 (ICD-10-CM) - Herniated nucleus pulposus, L5-S1.  Patient demonstrates muscle weakness, reduced ROM, and fascial restrictions which are likely contributing to symptoms of pain and are negatively impacting patient ability to perform ADLs and functional mobility tasks. Patient will benefit from skilled physical therapy services to address these deficits to reduce pain and improve level of function with ADLs and functional mobility tasks.   OBJECTIVE IMPAIRMENTS: Abnormal gait, decreased activity tolerance, decreased mobility, decreased ROM, decreased strength, hypomobility, increased fascial restrictions, impaired perceived functional ability, postural dysfunction, and pain.   ACTIVITY LIMITATIONS: carrying, lifting, bending, sitting, standing, sleeping, and locomotion level  PARTICIPATION LIMITATIONS: meal prep, cleaning, laundry, driving, shopping, and community activity  REHAB POTENTIAL: Good    GOALS: Goals reviewed with patient? No  SHORT TERM GOALS: Target date: 06/21/2023  patient will be independent with initial HEP  Baseline: Goal status: In progress  2.  Patient will self report 50% improvement to improve tolerance for functional activity  Baseline:  Goal status: IN PROGRESS   LONG TERM GOALS: Target date: 08/02/2023  Patient will be independent in self management strategies to improve quality of life and functional outcomes.  Baseline:  Goal status: in progress  2.  Patient will self report 75% improvement to improve tolerance for functional activity  Baseline:  Goal status: in progress  3.  Patient will increase right leg MMT's to 4+ to 5/5 to allow navigation of steps without gait deviation or loss of balance   Baseline: see above Goal status: in  progress  4.  Patient will improve 5 times sit to stand score from 15.41 sec to 12 sec or less to demonstrate improved functional mobility and increased leg strength.    Baseline: 07/05/23: 13.08 sec Goal status: in progress  PLAN:  PT FREQUENCY: 2x/week  PT DURATION: 4 weeks  PLANNED INTERVENTIONS: 97164- PT Re-evaluation, 97110-Therapeutic exercises, 97530- Therapeutic activity, 97112- Neuromuscular re-education, 97535- Self Care, 36644- Manual therapy, [phone]- Gait training, Patient/Family education, Taping, Dry Needling, Joint mobilization, Joint manipulation, Spinal manipulation, Spinal mobilization,   PLAN FOR NEXT SESSION:  Continue POC and may progress as tolerated with emphasis on extension activities, core and postural strengthening  3:11 PM, 07/24/23 Rheannon Cerney Small Darey Hershberger MPT [place] [facility] [place] [phone]

## 2023-07-31 ENCOUNTER — Encounter (HOSPITAL_COMMUNITY): Payer: Commercial Managed Care - PPO

## 2023-08-02 ENCOUNTER — Ambulatory Visit (HOSPITAL_COMMUNITY): Payer: Commercial Managed Care - PPO

## 2023-08-02 ENCOUNTER — Encounter (HOSPITAL_COMMUNITY): Payer: Self-pay

## 2023-08-02 DIAGNOSIS — M5459 Other low back pain: Secondary | ICD-10-CM

## 2023-08-02 DIAGNOSIS — M545 Low back pain, unspecified: Secondary | ICD-10-CM

## 2023-08-02 DIAGNOSIS — R262 Difficulty in walking, not elsewhere classified: Secondary | ICD-10-CM

## 2023-08-02 NOTE — Therapy (Addendum)
 OUTPATIENT PHYSICAL THERAPY THORACOLUMBAR TREATMENT   Patient Name: Raven Smith MRN: [id number] DOB:July 20, 1970, 53 y.o., female Today's Date: 08/02/2023  END OF SESSION:  PT End of Session - 08/02/23 1348     Visit Number 9    Number of Visits 13    Date for PT Re-Evaluation 08/02/23    Authorization Type [employer]    PT Start Time 1348    PT Stop Time 1431    PT Time Calculation (min) 43 min    Activity Tolerance Patient tolerated treatment well    Behavior During Therapy [facility] for tasks assessed/performed            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   Hyperlipidemia 2014   rx'd zocor; didn't do   Irregular menstrual bleeding 04/23/2013   Past Surgical History:  Procedure Laterality Date   BIOPSY  02/09/2020   Procedure: BIOPSY;  Surgeon: Lanelle Bal, DO;  Location: [facility];  Service: Endoscopy;;   CHOLECYSTECTOMY     COLONOSCOPY WITH PROPOFOL N/A 02/09/2020   Procedure: COLONOSCOPY WITH PROPOFOL;  Surgeon: Lanelle Bal, DO;  Location: [facility];  Service: Endoscopy;  Laterality: N/A;  8:00am   ESOPHAGOGASTRODUODENOSCOPY (EGD) WITH PROPOFOL N/A 02/09/2020   Procedure: ESOPHAGOGASTRODUODENOSCOPY (EGD) WITH PROPOFOL;  Surgeon: Lanelle Bal, DO;  Location: [facility];  Service: Endoscopy;  Laterality: N/A;   TUBAL LIGATION     Patient Active Problem List   Diagnosis Date Noted   E. coli urinary tract infection 05/24/2021   Vaginal dryness 04/25/2021   Encounter for screening fecal occult blood testing 04/25/2021   Encounter for gynecological examination with Papanicolaou smear of cervix 04/25/2021   Iron deficiency anemia due to chronic blood loss 03/31/2021   Menorrhagia with irregular cycle 03/31/2021   Abdominal pain 04/20/2020   GERD (gastroesophageal reflux disease) 01/15/2020   Anemia 01/15/2020   Constipation 01/15/2020   Dysmenorrhea 12/25/2018    Dyspareunia in female 12/25/2018   Chest pain 07/21/2014   Dyspnea 07/21/2014   Palpitations 07/21/2014   Irregular menstrual bleeding 04/23/2013   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    PCP: [facility]  REFERRING PROVIDER: Bedelia Person, MD  REFERRING DIAG: [phone] (ICD-10-CM) - Herniated nucleus pulposus, L5-S1  Rationale for Evaluation and Treatment: Rehabilitation  THERAPY DIAG:  Other low back pain  Low back pain, unspecified back pain laterality, unspecified chronicity, unspecified whether sciatica present  Difficulty in walking, not elsewhere classified  ONSET DATE: Thanksgiving 2024  SUBJECTIVE:  SUBJECTIVE STATEMENT: Patient reports last few days she had some knots in her back from doing more housework than usual because her back was feeling good. Today she feels okay, not good. Still has some difficulty with things like cleaning, lifting, stuff around home, etc.  Chronic pain for years but this was definitely worse/more.  No fall or injury that she can attribute it to; saw PCP; did an MRI; referred to Dr. Maisie Fus; got an epidural 05/21/23; was sore for a few weeks; maybe now a little better; just very uncomfortable in the low back; pain with twisting; sometimes sharp.  Pain with bending forward; disturbs sleep  PERTINENT HISTORY:  Her daughter was here for PT  PAIN:  Are you having pain? Yes: NPRS scale: 5/10; highest 9/10 Pain location: low back; middle Pain description: sharp, tight, aching and throbbing Aggravating factors: bending, twisting Relieving factors: Aleve  PRECAUTIONS: None  Right side some N/T; has been going on  a few weeks; both knees and feet hurt sometimes; tingling in both feet right much worse than left  WEIGHT BEARING RESTRICTIONS: No  FALLS:  Has patient fallen in last 6 months? No   PLOF: Independent  PATIENT GOALS: be able to walk, sit and move without pain  NEXT MD VISIT: April 7th, 2025  OBJECTIVE:  Note: Objective measures were completed at Evaluation unless otherwise noted.  DIAGNOSTIC FINDINGS:  IMPRESSION: 1. At L4-5 there is a mild disc bulge with a right foraminal/lateral disc protrusion in close proximity to the right L4 nerve root. Mild right foraminal stenosis. No left foraminal stenosis. Mild bilateral lateral recess narrowing. 2. At L5-S1 there is a mild disc bulge with a small central disc protrusion. Mild bilateral facet arthropathy. 3. No acute osseous injury of the lumbar spine.     Electronically Signed   By: Elige Ko M.D.   On: 04/25/2023 12:55  PATIENT SURVEYS:  Modified Oswestry 07/05/23: 23/50 = 46% from 29/50  08/02/23: 23 / 50 = 46.0 %  COGNITION: Overall cognitive status: Within functional limits for tasks assessed     SENSATION: Reports of numbness both feet  POSTURE: decreased lumbar lordosis  PALPATION: Tender L4-S1 paraspinals  LUMBAR ROM:   AROM eval 07/05/23 08/02/23  Flexion Finger tips to mid shin Finger tips to the ankles To midshin  Extension 25% available 25% available* 25% avail  Right lateral flexion     Left lateral flexion     Right rotation     Left rotation      (Blank rows = not tested) *repeated flex/ext in standing did not aggravate/relieve the pain  LOWER EXTREMITY ROM:     Active  Right eval Left eval  Hip flexion    Hip extension    Hip abduction    Hip adduction    Hip internal rotation    Hip external rotation    Knee flexion    Knee extension    Ankle dorsiflexion    Ankle plantarflexion    Ankle inversion    Ankle eversion     (Blank rows = not tested)  LOWER EXTREMITY MMT:    MMT  Right eval Left eval Right 07/05/23 Left 07/05/23 R 08/02/23 L 08/02/23  Hip flexion 4 5 4- 4 4+/5 4+/5  Hip extension 3- 3+ 3- 3- 4-/5 4-/5  Hip abduction   4- 4-    Hip adduction        Hip internal rotation        Hip external rotation  Knee flexion 4- 4+ 5 5 5/5 5/5  Knee extension 4+ 5 5 5  5/5 5/5  Ankle dorsiflexion 4 4 4  3+ 4+/5 4+/5  Ankle plantarflexion   4 4    Ankle inversion        Ankle eversion         (Blank rows = not tested)  FUNCTIONAL TESTS:  5 times sit to stand: 07/05/23: 13.08 sec from 15.41 sec 2 minute walk test: 07/05/23: 547 ft  08/02/2023: 5 Times Sit to Stand: 13 sec 2 minute walk test: 544 ft   GAIT: Distance walked: 50 ft  in gym Assistive device utilized: None Level of assistance: Modified independence Comments: decreased gait speed  TREATMENT DATE:  08/02/23 Progress Note:  5 Times sit to stand 2 Minute walk test Lumbar ROM LE MMT Mod Oswestry   07/24/23 Supine: Moist heat to low back x 5'  LTR x 10 each Left sidelying with bolster x 1' sidelying with soft bolster with rotation "open book" x 10 each Standing side stretch overhead x 10 Seated Ball roll out for flexion x 10 Updated HEP   07/19/23 Prone lying with moist heat x 5' Manual thoracic and lumbar mobilizations grad 2 and soft tissue massage to decrease pain and muscle spasm x 12' Discussion of dry needling and issued instructions  Prone press up x 10 Glute sets 5" hold x 10 Seated  Thoracic extension x 10 Thoracic and lumbar rotation x 10    PATIENT EDUCATION:  Education details: Patient educated on exam findings, POC, scope of PT, HEP, and what to expect next visit. Person educated: Patient Education method: Explanation, Demonstration, and Handouts Education comprehension: verbalized understanding, returned demonstration, verbal cues required, and tactile cues required  HOME EXERCISE PROGRAM: 07/03/23 prone hip extension, shoulder extension  Access Code:  [email or web address] URL: [email or web address] Date: 06/28/2023  -Supine Lower Trunk Rotation  - 1 x daily - 7 x weekly - 1 sets - 10 reps - Supine Dead Bug with Leg Extension  - 1 x daily - 7 x weekly - 1 sets - 10 reps   Access Code: [email or web address] Date: 06/27/2023 - Supine Transversus Abdominis Bracing - Hands on Stomach  - 1 x daily - 7 x weekly - 1 sets - 10 reps - 5 sec hold - Supine Bridge  - 1 x daily - 7 x weekly - 1 sets - 10 reps Access Code: [email or web address] URL: [email or web address] Date: 06/07/2023 Prepared by: [facility]  Exercises - Prone Press Up  - 4-5 x daily - 7 x weekly - 1 sets - 10 reps - Standing Lumbar Extension with Counter  - 4-5 x daily - 7 x weekly - 1 sets - 10 reps  ASSESSMENT:  CLINICAL IMPRESSION: Progress note completed on this day. Patient demonstrates some improvements in LE strength. Self perceived patient functional survey and functional testing remain around the same level as previous testing. Patient reports daily pain that varies in intensity day to day that continues to limit patient independence with ADLs, iADLs, and social activities that involve lifting, bending, carrying, etc. Patient would benefit from continued physical therapy in order to address the above In order to promote return to optimal function.      Eval:Patient is a 53 y.o. female who was seen today for physical therapy evaluation and treatment for M51.27 (ICD-10-CM) - Herniated nucleus pulposus, L5-S1.  Patient demonstrates muscle weakness, reduced ROM, and fascial restrictions which are likely contributing to symptoms of pain and are negatively  impacting patient ability to perform ADLs and functional mobility tasks. Patient will benefit from skilled physical therapy services to address these deficits to reduce pain and improve level of function with ADLs and functional mobility tasks.   OBJECTIVE IMPAIRMENTS: Abnormal gait, decreased activity tolerance, decreased  mobility, decreased ROM, decreased strength, hypomobility, increased fascial restrictions, impaired perceived functional ability, postural dysfunction, and pain.   ACTIVITY LIMITATIONS: carrying, lifting, bending, sitting, standing, sleeping, and locomotion level  PARTICIPATION LIMITATIONS: meal prep, cleaning, laundry, driving, shopping, and community activity  REHAB POTENTIAL: Good    GOALS: Goals reviewed with patient? No  SHORT TERM GOALS: Target date: 08/16/2023  patient will be independent with initial HEP  Baseline: Goal status: MET  2.  Patient will self report 50% improvement to improve tolerance for functional activity  Baseline:  Goal status: IN PROGRESS   LONG TERM GOALS: Target date: 08/30/2023  Patient will be independent in self management strategies to improve quality of life and functional outcomes.  Baseline:  Goal status: in progress  2.  Patient will self report 75% improvement to improve tolerance for functional activity  Baseline:  Goal status: in progress  3.  Patient will increase right leg MMT's to 4+ to 5/5 to allow navigation of steps without gait deviation or loss of balance   Baseline: see above Goal status: in progress  4.  Patient will improve 5 times sit to stand score from 15.41 sec to 12 sec or less to demonstrate improved functional mobility and increased leg strength.    Baseline: 08/02/23: 13 sec Goal status: in progress  PLAN:  PT FREQUENCY: 2x/week  PT DURATION: 4 weeks  PLANNED INTERVENTIONS: 97164- PT Re-evaluation, 97110-Therapeutic exercises, 97530- Therapeutic activity, 97112- Neuromuscular re-education, 97535- Self Care, 64403- Manual therapy, [phone]- Gait training, Patient/Family education, Taping, Dry Needling, Joint mobilization, Joint manipulation, Spinal manipulation, Spinal mobilization,   PLAN FOR NEXT SESSION:  Continue POC and may progress as tolerated with emphasis on extension activities, core and postural  strengthening, body mechanics with functional tasks    2:55 PM, 08/02/23 Chryl Heck, PT, DPT [facility] Medicare Auth Request Information  Date of referral: 08/02/2023  Referring provider: Bedelia Person, MD Referring diagnosis (ICD 10)? M51.27 (ICD-10-CM) - Herniated nucleus pulposus, L5-S1 Treatment diagnosis (ICD 10)? (if different than referring diagnosis) Other low back pain  Low back pain, unspecified back pain laterality, unspecified chronicity, unspecified whether sciatica present  Difficulty in walking, not elsewhere classified  Functional Tool Score: Modified Oswestry 08/02/23: 23 / 50 = 46.0 %  What was this (referring dx) caused by? Ongoing Issue  Ashby Dawes of Condition: Chronic (continuous duration > 3 months)   Laterality: Both  Current Functional Measure Score: Modified Oswestry 08/02/23: 23 / 50 = 46.0 %  Objective measurements identify impairments when they are compared to normal values, the uninvolved extremity, and prior level of function.  [x]  Yes  []  No  Objective assessment of functional ability: Moderate functional limitations   Briefly describe symptoms: just very uncomfortable in the low back; pain with twisting; sometimes sharp.  Pain with bending forward; disturbs sleep. Knots in low back  How did symptoms start: Insidious  Average pain intensity:  Last 24 hours: 8/10  Past week: 5/10  How often does the pt experience symptoms? Frequently  How much have the symptoms interfered with usual daily activities? Moderately  How has condition changed since care began at this facility? A little better  In general, how is  the patients overall health? Good   BACK PAIN (STarT Back Screening Tool) No

## 2023-08-14 ENCOUNTER — Ambulatory Visit (HOSPITAL_COMMUNITY): Attending: Neurosurgery

## 2023-08-14 DIAGNOSIS — M545 Low back pain, unspecified: Secondary | ICD-10-CM | POA: Insufficient documentation

## 2023-08-14 DIAGNOSIS — M5459 Other low back pain: Secondary | ICD-10-CM | POA: Diagnosis present

## 2023-08-14 DIAGNOSIS — R262 Difficulty in walking, not elsewhere classified: Secondary | ICD-10-CM | POA: Insufficient documentation

## 2023-08-14 NOTE — Therapy (Addendum)
 OUTPATIENT PHYSICAL THERAPY THORACOLUMBAR TREATMENT   Patient Name: RADLEY TESTON MRN: [id number] DOB:03-10-1971, 53 y.o., female Today's Date: 08/14/2023  END OF SESSION:  PT End of Session - 08/14/23 0903     Visit Number 10    Number of Visits 13    Date for PT Re-Evaluation 08/02/23    Authorization Type U[employer]    Authorization Time Period no auth needed   ref# [id number]   co-pay 25  no max visits    PT Start Time 0900    PT Stop Time 0938    PT Time Calculation (min) 38 min    Activity Tolerance Patient tolerated treatment well    Behavior During Therapy [facility] for tasks assessed/performed            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   Hyperlipidemia 2014   rx'd zocor; didn't do   Irregular menstrual bleeding 04/23/2013   Past Surgical History:  Procedure Laterality Date   BIOPSY  02/09/2020   Procedure: BIOPSY;  Surgeon: Lanelle Bal, DO;  [facility];  Service: Endoscopy;;   CHOLECYSTECTOMY     COLONOSCOPY WITH PROPOFOL N/A 02/09/2020   Procedure: COLONOSCOPY WITH PROPOFOL;  Surgeon: Lanelle Bal, DO;  [facility];  Service: Endoscopy;  Laterality: N/A;  8:00am   ESOPHAGOGASTRODUODENOSCOPY (EGD) WITH PROPOFOL N/A 02/09/2020   Procedure: ESOPHAGOGASTRODUODENOSCOPY (EGD) WITH PROPOFOL;  Surgeon: Lanelle Bal, DO;  [facility];  Service: Endoscopy;  Laterality: N/A;   TUBAL LIGATION     Patient Active Problem List   Diagnosis Date Noted   E. coli urinary tract infection 05/24/2021   Vaginal dryness 04/25/2021   Encounter for screening fecal occult blood testing 04/25/2021   Encounter for gynecological examination with Papanicolaou smear of cervix 04/25/2021   Iron deficiency anemia due to chronic blood loss 03/31/2021   Menorrhagia with irregular cycle 03/31/2021   Abdominal pain 04/20/2020   GERD (gastroesophageal reflux  disease) 01/15/2020   Anemia 01/15/2020   Constipation 01/15/2020   Dysmenorrhea 12/25/2018   Dyspareunia in female 12/25/2018   Chest pain 07/21/2014   Dyspnea 07/21/2014   Palpitations 07/21/2014   Irregular menstrual bleeding 04/23/2013   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    PCP: [facility]  REFERRING PROVIDER: Bedelia Person, MD  REFERRING DIAG: [id number] (ICD-10-CM) - Herniated nucleus pulposus, L5-S1  Rationale for Evaluation and Treatment: Rehabilitation  THERAPY DIAG:  Other low back pain  Low back pain, unspecified back pain laterality, unspecified chronicity, unspecified whether sciatica present  Difficulty in walking, not elsewhere classified  ONSET DATE: Thanksgiving 2024  SUBJECTIVE:  SUBJECTIVE STATEMENT: Really busy the past few days with family stuff; her mom had to have an emergency surgery over the last weekend and she is the primary caregiver for her.   Chronic pain for years but this was definitely worse/more.  No fall or injury that she can attribute it to; saw PCP; did an MRI; referred to Dr. Maisie Fus; got an epidural 05/21/23; was sore for a few weeks; maybe now a little better; just very uncomfortable in the low back; pain with twisting; sometimes sharp.  Pain with bending forward; disturbs sleep  PERTINENT HISTORY:  Her daughter was here for PT  PAIN:  Are you having pain? Yes: NPRS scale: 5/10; highest 9/10 Pain location: low back; middle Pain description: sharp, tight, aching and throbbing Aggravating factors: bending, twisting Relieving factors: Aleve  PRECAUTIONS: None  Right side some N/T; has been going on  a few weeks; both knees and feet hurt sometimes; tingling in both feet right much worse than left  WEIGHT BEARING RESTRICTIONS: No  FALLS:  Has patient fallen in last 6 months? No   PLOF: Independent  PATIENT GOALS: be able to walk, sit and move without pain  NEXT MD VISIT: April 7th, 2025  OBJECTIVE:  Note: Objective measures were completed at Evaluation unless otherwise noted.  DIAGNOSTIC FINDINGS:  IMPRESSION: 1. At L4-5 there is a mild disc bulge with a right foraminal/lateral disc protrusion in close proximity to the right L4 nerve root. Mild right foraminal stenosis. No left foraminal stenosis. Mild bilateral lateral recess narrowing. 2. At L5-S1 there is a mild disc bulge with a small central disc protrusion. Mild bilateral facet arthropathy. 3. No acute osseous injury of the lumbar spine.     Electronically Signed   By: Elige Ko M.D.   On: 04/25/2023 12:55  PATIENT SURVEYS:  Modified Oswestry 07/05/23: 23/50 = 46% from 29/50  08/02/23: 23 / 50 = 46.0 %  COGNITION: Overall cognitive status: Within functional limits for tasks assessed     SENSATION: Reports of numbness both feet  POSTURE: decreased lumbar lordosis  PALPATION: Tender L4-S1 paraspinals  LUMBAR ROM:   AROM eval 07/05/23 08/02/23  Flexion Finger tips to mid shin Finger tips to the ankles To midshin  Extension 25% available 25% available* 25% avail  Right lateral flexion     Left lateral flexion     Right rotation     Left rotation      (Blank rows = not tested) *repeated flex/ext in standing did not aggravate/relieve the pain  LOWER EXTREMITY ROM:     Active  Right eval Left eval  Hip flexion    Hip extension    Hip abduction    Hip adduction    Hip internal rotation    Hip external rotation    Knee flexion    Knee extension    Ankle dorsiflexion    Ankle plantarflexion    Ankle inversion    Ankle eversion     (Blank rows = not tested)  LOWER EXTREMITY MMT:    MMT  Right eval Left eval Right 07/05/23 Left 07/05/23 R 08/02/23 L 08/02/23  Hip flexion 4 5 4- 4 4+/5 4+/5  Hip extension 3- 3+ 3- 3- 4-/5 4-/5  Hip abduction   4- 4-    Hip adduction        Hip internal rotation        Hip external rotation        Knee flexion 4- 4+ 5 5  5/5 5/5  Knee extension 4+ 5 5 5  5/5 5/5  Ankle dorsiflexion 4 4 4  3+ 4+/5 4+/5  Ankle plantarflexion   4 4    Ankle inversion        Ankle eversion         (Blank rows = not tested)  FUNCTIONAL TESTS:  5 times sit to stand: 07/05/23: 13.08 sec from 15.41 sec 2 minute walk test: 07/05/23: 547 ft  08/14/2023: 5 Times Sit to Stand: 13 sec 2 minute walk test: 544 ft   GAIT: Distance walked: 50 ft  in gym Assistive device utilized: None Level of assistance: Modified independence Comments: decreased gait speed  TREATMENT DATE:  08/14/23 Nustep seat 9 x 5' dynamic warm up Heel raises on incline x 20 Slant board 5 x 20" Hip vectors 2" 2 x 5 each Supine: LTR x 10 TA contraction 5" x 10   08/02/23 Progress Note:  5 Times sit to stand 2 Minute walk test Lumbar ROM LE MMT Mod Oswestry   07/24/23 Supine: Moist heat to low back x 5'  LTR x 10 each Left sidelying with bolster x 1' sidelying with soft bolster with rotation "open book" x 10 each Standing side stretch overhead x 10 Seated Ball roll out for flexion x 10 Updated HEP   07/19/23 Prone lying with moist heat x 5' Manual thoracic and lumbar mobilizations grad 2 and soft tissue massage to decrease pain and muscle spasm x 12' Discussion of dry needling and issued instructions  Prone press up x 10 Glute sets 5" hold x 10 Seated  Thoracic extension x 10 Thoracic and lumbar rotation x 10    PATIENT EDUCATION:  Education details: Patient educated on exam findings, POC, scope of PT, HEP, and what to expect next visit. Person educated: Patient Education method: Explanation, Demonstration, and Handouts Education comprehension: verbalized  understanding, returned demonstration, verbal cues required, and tactile cues required  HOME EXERCISE PROGRAM: 07/03/23 prone hip extension, shoulder extension  Access Code: [email or web address] URL: [email or web address] Date: 06/28/2023  -Supine Lower Trunk Rotation  - 1 x daily - 7 x weekly - 1 sets - 10 reps - Supine Dead Bug with Leg Extension  - 1 x daily - 7 x weekly - 1 sets - 10 reps   Access Code: [email or web address] Date: 06/27/2023 - Supine Transversus Abdominis Bracing - Hands on Stomach  - 1 x daily - 7 x weekly - 1 sets - 10 reps - 5 sec hold - Supine Bridge  - 1 x daily - 7 x weekly - 1 sets - 10 reps Access Code: [email or web address] URL: [email or web address] Date: 06/07/2023 Prepared by: [facility]  Exercises - Prone Press Up  - 4-5 x daily - 7 x weekly - 1 sets - 10 reps - Standing Lumbar Extension with Counter  - 4-5 x daily - 7 x weekly - 1 sets - 10 reps  ASSESSMENT:  CLINICAL IMPRESSION: Continued with focus on core strengthening and abdominal bracing. Patient with noted improved mobility with lateral trunk rotatoin today; needs cues for breathing with bracing.   Patient would benefit from continued physical therapy in order to address the above In order to promote return to optimal function.      Eval:Patient is a 53 y.o. female who was seen today for physical therapy evaluation and treatment for M51.27 (ICD-10-CM) - Herniated nucleus pulposus, L5-S1.  Patient demonstrates muscle weakness, reduced ROM, and fascial restrictions which are likely contributing to symptoms of pain and are  negatively impacting patient ability to perform ADLs and functional mobility tasks. Patient will benefit from skilled physical therapy services to address these deficits to reduce pain and improve level of function with ADLs and functional mobility tasks.   OBJECTIVE IMPAIRMENTS: Abnormal gait, decreased activity tolerance, decreased mobility, decreased ROM, decreased strength,  hypomobility, increased fascial restrictions, impaired perceived functional ability, postural dysfunction, and pain.   ACTIVITY LIMITATIONS: carrying, lifting, bending, sitting, standing, sleeping, and locomotion level  PARTICIPATION LIMITATIONS: meal prep, cleaning, laundry, driving, shopping, and community activity  REHAB POTENTIAL: Good    GOALS: Goals reviewed with patient? No  SHORT TERM GOALS: Target date: 08/16/2023  patient will be independent with initial HEP  Baseline: Goal status: MET  2.  Patient will self report 50% improvement to improve tolerance for functional activity  Baseline:  Goal status: IN PROGRESS   LONG TERM GOALS: Target date: 08/30/2023  Patient will be independent in self management strategies to improve quality of life and functional outcomes.  Baseline:  Goal status: in progress  2.  Patient will self report 75% improvement to improve tolerance for functional activity  Baseline:  Goal status: in progress  3.  Patient will increase right leg MMT's to 4+ to 5/5 to allow navigation of steps without gait deviation or loss of balance   Baseline: see above Goal status: in progress  4.  Patient will improve 5 times sit to stand score from 15.41 sec to 12 sec or less to demonstrate improved functional mobility and increased leg strength.    Baseline: 08/02/23: 13 sec Goal status: in progress  PLAN:  PT FREQUENCY: 2x/week  PT DURATION: 4 weeks  PLANNED INTERVENTIONS: 97164- PT Re-evaluation, 97110-Therapeutic exercises, 97530- Therapeutic activity, 97112- Neuromuscular re-education, 97535- Self Care, 45409- Manual therapy, [phone]- Gait training, Patient/Family education, Taping, Dry Needling, Joint mobilization, Joint manipulation, Spinal manipulation, Spinal mobilization,   PLAN FOR NEXT SESSION:  Continue POC and may progress as tolerated with emphasis on extension activities, core and postural strengthening, body mechanics with functional  tasks

## 2023-08-15 ENCOUNTER — Encounter (HOSPITAL_COMMUNITY)

## 2023-08-24 ENCOUNTER — Ambulatory Visit (HOSPITAL_COMMUNITY)

## 2023-08-24 DIAGNOSIS — M5459 Other low back pain: Secondary | ICD-10-CM | POA: Diagnosis not present

## 2023-08-24 DIAGNOSIS — M545 Low back pain, unspecified: Secondary | ICD-10-CM

## 2023-08-24 DIAGNOSIS — R262 Difficulty in walking, not elsewhere classified: Secondary | ICD-10-CM

## 2023-08-24 NOTE — Therapy (Signed)
 OUTPATIENT PHYSICAL THERAPY THORACOLUMBAR TREATMENT   Patient Name: Raven Smith MRN: [id number] DOB:10-03-1970, 53 y.o., female Today's Date: 08/24/2023  END OF SESSION:  PT End of Session - 08/24/23 1352     Visit Number 11    Number of Visits 13    Date for PT Re-Evaluation 08/30/23    Authorization Type U[employer]    Authorization Time Period no auth needed   ref# [id number]   co-pay 25  no max visits    Authorization - Number of Visits 25    PT Start Time 1350    PT Stop Time 1430    PT Time Calculation (min) 40 min    Activity Tolerance Patient tolerated treatment well    Behavior During Therapy 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   Hyperlipidemia 2014   rx'd zocor; didn't do   Irregular menstrual bleeding 04/23/2013   Past Surgical History:  Procedure Laterality Date   BIOPSY  02/09/2020   Procedure: BIOPSY;  Surgeon: Lanelle Bal, DO;  [facility];  Service: Endoscopy;;   CHOLECYSTECTOMY     COLONOSCOPY WITH PROPOFOL N/A 02/09/2020   Procedure: COLONOSCOPY WITH PROPOFOL;  Surgeon: Lanelle Bal, DO;  [facility];  Service: Endoscopy;  Laterality: N/A;  8:00am   ESOPHAGOGASTRODUODENOSCOPY (EGD) WITH PROPOFOL N/A 02/09/2020   Procedure: ESOPHAGOGASTRODUODENOSCOPY (EGD) WITH PROPOFOL;  Surgeon: Lanelle Bal, DO;  [facility];  Service: Endoscopy;  Laterality: N/A;   TUBAL LIGATION     Patient Active Problem List   Diagnosis Date Noted   E. coli urinary tract infection 05/24/2021   Vaginal dryness 04/25/2021   Encounter for screening fecal occult blood testing 04/25/2021   Encounter for gynecological examination with Papanicolaou smear of cervix 04/25/2021   Iron deficiency anemia due to chronic blood loss 03/31/2021   Menorrhagia with irregular cycle 03/31/2021   Abdominal pain  04/20/2020   GERD (gastroesophageal reflux disease) 01/15/2020   Anemia 01/15/2020   Constipation 01/15/2020   Dysmenorrhea 12/25/2018   Dyspareunia in female 12/25/2018   Chest pain 07/21/2014   Dyspnea 07/21/2014   Palpitations 07/21/2014   Irregular menstrual bleeding 04/23/2013   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    PCP: [facility]  REFERRING PROVIDER: Bedelia Person, MD  REFERRING DIAG: [phone] (ICD-10-CM) - Herniated nucleus pulposus, L5-S1  Rationale for Evaluation and Treatment: Rehabilitation  THERAPY DIAG:  Other low back pain  Low back pain, unspecified back pain laterality, unspecified chronicity, unspecified whether sciatica present  Difficulty in walking, not elsewhere classified  ONSET DATE: Thanksgiving 2024  SUBJECTIVE:  SUBJECTIVE STATEMENT: Really busy the past few days with family stuff; her mom had to have an emergency surgery over the last weekend and she is the primary caregiver for her.   Chronic pain for years but this was definitely worse/more.  No fall or injury that she can attribute it to; saw PCP; did an MRI; referred to Dr. Maisie Fus; got an epidural 05/21/23; was sore for a few weeks; maybe now a little better; just very uncomfortable in the low back; pain with twisting; sometimes sharp.  Pain with bending forward; disturbs sleep  PERTINENT HISTORY:  Her daughter was here for PT  PAIN:  Are you having pain? Yes: NPRS scale: 5/10; highest 9/10 Pain location: low back; middle Pain description: sharp, tight, aching and throbbing Aggravating factors: bending, twisting Relieving factors: Aleve  PRECAUTIONS:  None  Right side some N/T; has been going on a few weeks; both knees and feet hurt sometimes; tingling in both feet right much worse than left  WEIGHT BEARING RESTRICTIONS: No  FALLS:  Has patient fallen in last 6 months? No   PLOF: Independent  PATIENT GOALS: be able to walk, sit and move without pain  NEXT MD VISIT: April 7th, 2025  OBJECTIVE:  Note: Objective measures were completed at Evaluation unless otherwise noted.  DIAGNOSTIC FINDINGS:  IMPRESSION: 1. At L4-5 there is a mild disc bulge with a right foraminal/lateral disc protrusion in close proximity to the right L4 nerve root. Mild right foraminal stenosis. No left foraminal stenosis. Mild bilateral lateral recess narrowing. 2. At L5-S1 there is a mild disc bulge with a small central disc protrusion. Mild bilateral facet arthropathy. 3. No acute osseous injury of the lumbar spine.     Electronically Signed   By: Elige Ko M.D.   On: 04/25/2023 12:55  PATIENT SURVEYS:  Modified Oswestry 07/05/23: 23/50 = 46% from 29/50  08/02/23: 23 / 50 = 46.0 %  COGNITION: Overall cognitive status: Within functional limits for tasks assessed     SENSATION: Reports of numbness both feet  POSTURE: decreased lumbar lordosis  PALPATION: Tender L4-S1 paraspinals  LUMBAR ROM:   AROM eval 07/05/23 08/02/23  Flexion Finger tips to mid shin Finger tips to the ankles To midshin  Extension 25% available 25% available* 25% avail  Right lateral flexion     Left lateral flexion     Right rotation     Left rotation      (Blank rows = not tested) *repeated flex/ext in standing did not aggravate/relieve the pain  LOWER EXTREMITY ROM:     Active  Right eval Left eval  Hip flexion    Hip extension    Hip abduction    Hip adduction    Hip internal rotation    Hip external rotation    Knee flexion    Knee extension    Ankle dorsiflexion    Ankle plantarflexion    Ankle inversion    Ankle eversion     (Blank rows =  not tested)  LOWER EXTREMITY MMT:    MMT Right eval Left eval Right 07/05/23 Left 07/05/23 R 08/02/23 L 08/02/23  Hip flexion 4 5 4- 4 4+/5 4+/5  Hip extension 3- 3+ 3- 3- 4-/5 4-/5  Hip abduction   4- 4-    Hip adduction        Hip internal rotation        Hip external rotation        Knee flexion 4- 4+ 5 5  5/5 5/5  Knee extension 4+ 5 5 5  5/5 5/5  Ankle dorsiflexion 4 4 4  3+ 4+/5 4+/5  Ankle plantarflexion   4 4    Ankle inversion        Ankle eversion         (Blank rows = not tested)  FUNCTIONAL TESTS:  5 times sit to stand: 07/05/23: 13.08 sec from 15.41 sec 2 minute walk test: 07/05/23: 547 ft  08/24/2023: 5 Times Sit to Stand: 13 sec 2 minute walk test: 544 ft   GAIT: Distance walked: 50 ft  in gym Assistive device utilized: None Level of assistance: Modified independence Comments: decreased gait speed  TREATMENT DATE:  08/24/23 Moist heat to low back x 5' in supine to decrease pain and improve soft tissue extensibility Supine: Abdominal bracing 5" x 10 LTR x 10 Nustep seat 9 x 5' dynamic warm up level 3 Standing  Heel raises 2 x 10 Slant board 5 x 20"  RTB rows x 15      08/14/23 Nustep seat 9 x 5' dynamic warm up Heel raises on incline x 20 Slant board 5 x 20" Hip vectors 2" 2 x 5 each Supine: LTR x 10 TA contraction 5" x 10   08/02/23 Progress Note:  5 Times sit to stand 2 Minute walk test Lumbar ROM LE MMT Mod Oswestry   07/24/23 Supine: Moist heat to low back x 5'  LTR x 10 each Left sidelying with bolster x 1' sidelying with soft bolster with rotation "open book" x 10 each Standing side stretch overhead x 10 Seated Ball roll out for flexion x 10 Updated HEP   07/19/23 Prone lying with moist heat x 5' Manual thoracic and lumbar mobilizations grad 2 and soft tissue massage to decrease pain and muscle spasm x 12' Discussion of dry needling and issued instructions  Prone press up x 10 Glute sets 5" hold x 10 Seated  Thoracic  extension x 10 Thoracic and lumbar rotation x 10    PATIENT EDUCATION:  Education details: Patient educated on exam findings, POC, scope of PT, HEP, and what to expect next visit. Person educated: Patient Education method: Explanation, Demonstration, and Handouts Education comprehension: verbalized understanding, returned demonstration, verbal cues required, and tactile cues required  HOME EXERCISE PROGRAM: 07/03/23 prone hip extension, shoulder extension  Access Code: [email or web address] URL: [email or web address] Date: 06/28/2023  -Supine Lower Trunk Rotation  - 1 x daily - 7 x weekly - 1 sets - 10 reps - Supine Dead Bug with Leg Extension  - 1 x daily - 7 x weekly - 1 sets - 10 reps   Access Code: [email or web address] Date: 06/27/2023 - Supine Transversus Abdominis Bracing - Hands on Stomach  - 1 x daily - 7 x weekly - 1 sets - 10 reps - 5 sec hold - Supine Bridge  - 1 x daily - 7 x weekly - 1 sets - 10 reps Access Code: [email or web address] URL: [email or web address] Date: 06/07/2023 Prepared by: [facility]  Exercises - Prone Press Up  - 4-5 x daily - 7 x weekly - 1 sets - 10 reps - Standing Lumbar Extension with Counter  - 4-5 x daily - 7 x weekly - 1 sets - 10 reps  ASSESSMENT:  CLINICAL IMPRESSION: Today's session with continued focus on mobility and core strengthening.  Difficulty with maintaining good posture with heel raises.  Added theraband exercises  for postural strengthening today. Patient with appropriate amount of fatigue with exercise; increased Nustep  to level 3.   Patient would benefit from continued physical therapy in order to address the above In order to promote return to optimal function.      Eval:Patient is a 53 y.o. female who was seen today for physical therapy evaluation and treatment for M51.27 (ICD-10-CM) - Herniated nucleus pulposus, L5-S1.  Patient demonstrates muscle weakness, reduced ROM, and fascial restrictions which are likely contributing to  symptoms of pain and are negatively impacting patient ability to perform ADLs and functional mobility tasks. Patient will benefit from skilled physical therapy services to address these deficits to reduce pain and improve level of function with ADLs and functional mobility tasks.   OBJECTIVE IMPAIRMENTS: Abnormal gait, decreased activity tolerance, decreased mobility, decreased ROM, decreased strength, hypomobility, increased fascial restrictions, impaired perceived functional ability, postural dysfunction, and pain.   ACTIVITY LIMITATIONS: carrying, lifting, bending, sitting, standing, sleeping, and locomotion level  PARTICIPATION LIMITATIONS: meal prep, cleaning, laundry, driving, shopping, and community activity  REHAB POTENTIAL: Good    GOALS: Goals reviewed with patient? No  SHORT TERM GOALS: Target date: 08/16/2023  patient will be independent with initial HEP  Baseline: Goal status: MET  2.  Patient will self report 50% improvement to improve tolerance for functional activity  Baseline:  Goal status: IN PROGRESS   LONG TERM GOALS: Target date: 08/30/2023  Patient will be independent in self management strategies to improve quality of life and functional outcomes.  Baseline:  Goal status: in progress  2.  Patient will self report 75% improvement to improve tolerance for functional activity  Baseline:  Goal status: in progress  3.  Patient will increase right leg MMT's to 4+ to 5/5 to allow navigation of steps without gait deviation or loss of balance   Baseline: see above Goal status: in progress  4.  Patient will improve 5 times sit to stand score from 15.41 sec to 12 sec or less to demonstrate improved functional mobility and increased leg strength.    Baseline: 08/02/23: 13 sec Goal status: in progress  PLAN:  PT FREQUENCY: 2x/week  PT DURATION: 4 weeks  PLANNED INTERVENTIONS: 97164- PT Re-evaluation, 97110-Therapeutic exercises, 97530- Therapeutic  activity, 97112- Neuromuscular re-education, 97535- Self Care, [id number]- Manual therapy, [phone]- Gait training, Patient/Family education, Taping, Dry Needling, Joint mobilization, Joint manipulation, Spinal manipulation, Spinal mobilization,   PLAN FOR NEXT SESSION:  Continue POC and may progress as tolerated with emphasis on extension activities, core and postural strengthening, body mechanics with functional tasks    2:31 PM, 08/24/23 Shekinah Pitones Small Zissel Biederman MPT [place] [facility] [place] [phone] [phone]

## 2023-08-29 ENCOUNTER — Ambulatory Visit (HOSPITAL_COMMUNITY)

## 2023-08-29 ENCOUNTER — Encounter (HOSPITAL_COMMUNITY): Payer: Self-pay

## 2023-08-29 DIAGNOSIS — R262 Difficulty in walking, not elsewhere classified: Secondary | ICD-10-CM

## 2023-08-29 DIAGNOSIS — M545 Low back pain, unspecified: Secondary | ICD-10-CM

## 2023-08-29 DIAGNOSIS — M5459 Other low back pain: Secondary | ICD-10-CM | POA: Diagnosis not present

## 2023-08-29 NOTE — Therapy (Addendum)
 OUTPATIENT PHYSICAL THERAPY THORACOLUMBAR TREATMENT/Progress Note/Re-eval   Patient Name: Raven Smith MRN: [id number] DOB:Oct 04, 1970, 53 y.o., female Today's Date: 08/29/2023  END OF SESSION:  PT End of Session - 08/29/23 1352     Visit Number 12    Number of Visits 20    Date for PT Re-Evaluation 09/26/23    Authorization Type UHC    Authorization Time Period no auth needed   ref# [id number]   co-pay 25  no max visits    PT Start Time 1351    PT Stop Time 1427    PT Time Calculation (min) 36 min    Activity Tolerance Patient tolerated treatment well    Behavior During Therapy [facility] for tasks assessed/performed            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   Hyperlipidemia 2014   rx'd zocor; didn't do   Irregular menstrual bleeding 04/23/2013   Past Surgical History:  Procedure Laterality Date   BIOPSY  02/09/2020   Procedure: BIOPSY;  Surgeon: Vinetta Greening, DO;  [facility];  Service: Endoscopy;;   CHOLECYSTECTOMY     COLONOSCOPY WITH PROPOFOL N/A 02/09/2020   Procedure: COLONOSCOPY WITH PROPOFOL;  Surgeon: Vinetta Greening, DO;  [facility];  Service: Endoscopy;  Laterality: N/A;  8:00am   ESOPHAGOGASTRODUODENOSCOPY (EGD) WITH PROPOFOL N/A 02/09/2020   Procedure: ESOPHAGOGASTRODUODENOSCOPY (EGD) WITH PROPOFOL;  Surgeon: Vinetta Greening, DO;  [facility];  Service: Endoscopy;  Laterality: N/A;   TUBAL LIGATION     Patient Active Problem List   Diagnosis Date Noted   E. coli urinary tract infection 05/24/2021   Vaginal dryness 04/25/2021   Encounter for screening fecal occult blood testing 04/25/2021   Encounter for gynecological examination with Papanicolaou smear of cervix 04/25/2021   Iron deficiency anemia due to chronic blood loss 03/31/2021   Menorrhagia with irregular cycle 03/31/2021   Abdominal pain 04/20/2020   GERD  (gastroesophageal reflux disease) 01/15/2020   Anemia 01/15/2020   Constipation 01/15/2020   Dysmenorrhea 12/25/2018   Dyspareunia in female 12/25/2018   Chest pain 07/21/2014   Dyspnea 07/21/2014   Palpitations 07/21/2014   Irregular menstrual bleeding 04/23/2013   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   Hyperlipidemia 2014     [facility]: [facility]  REFERRING PROVIDER: Van Gelinas, MD  REFERRING DIAG: M51.27 (ICD-10-CM) - Herniated nucleus pulposus, L5-S1  Rationale for Evaluation and Treatment: Rehabilitation  THERAPY DIAG:  Other low back pain - Plan: PT plan of care cert/re-cert  Low back pain, unspecified back pain laterality, unspecified chronicity, unspecified whether sciatica present - Plan: PT plan of care cert/re-cert  Difficulty in walking, not elsewhere classified - Plan: PT plan of care cert/re-cert  ONSET DATE: Thanksgiving 2024  SUBJECTIVE:  SUBJECTIVE STATEMENT: Patient reports increased pain in low back recently, reporting radiating symptoms to RLE to toes. Reports N/T and weakness. Has been more active recently, more walking, more cleaning around house.   Chronic pain for years but this was definitely worse/more.  No fall or injury that she can attribute it to; saw PCP; did an MRI; referred to Dr. Maisie Fus; got an epidural 05/21/23; was sore for a few weeks; maybe now a little better; just very uncomfortable in the low back; pain with twisting; sometimes sharp.  Pain with bending forward; disturbs sleep  PERTINENT HISTORY:  Her daughter was here for PT  PAIN:  Are you having pain? Yes: NPRS scale: 5/10; highest 9/10 Pain location: low back; middle Pain description: sharp, tight, aching and throbbing Aggravating factors: bending, twisting Relieving factors:  Aleve  PRECAUTIONS: None  Right side some N/T; has been going on a few weeks; both knees and feet hurt sometimes; tingling in both feet right much worse than left  WEIGHT BEARING RESTRICTIONS: No  FALLS:  Has patient fallen in last 6 months? No   PLOF: Independent  PATIENT GOALS: be able to walk, sit and move without pain  NEXT MD VISIT: April 7th, 2025  OBJECTIVE:  Note: Objective measures were completed at Evaluation unless otherwise noted.  DIAGNOSTIC FINDINGS:  IMPRESSION: 1. At L4-5 there is a mild disc bulge with a right foraminal/lateral disc protrusion in close proximity to the right L4 nerve root. Mild right foraminal stenosis. No left foraminal stenosis. Mild bilateral lateral recess narrowing. 2. At L5-S1 there is a mild disc bulge with a small central disc protrusion. Mild bilateral facet arthropathy. 3. No acute osseous injury of the lumbar spine.     Electronically Signed   By: Elige Ko M.D.   On: 04/25/2023 12:55  PATIENT SURVEYS:  Modified Oswestry 07/05/23: 23/50 = 46% from 29/50  08/02/23: 23 / 50 = 46.0 %]\  08/29/23: Modified Oswestry Low Back Pain Disability Questionnaire: 31 / 50 = 62.0 %   COGNITION: Overall cognitive status: Within functional limits for tasks assessed     SENSATION: Reports of numbness both feet  POSTURE: decreased lumbar lordosis  PALPATION: Tender L4-S1 paraspinals  LUMBAR ROM:   AROM eval 07/05/23 08/02/23 08/29/23  Flexion Finger tips to mid shin Finger tips to the ankles To midshin To midshin  Extension 25% available 25% available* 25% avail 25% avail   Right lateral flexion      Left lateral flexion      Right rotation      Left rotation       (Blank rows = not tested) *repeated flex/ext in standing did not aggravate/relieve the pain  LOWER EXTREMITY ROM:     Active  Right eval Left eval  Hip flexion    Hip extension    Hip abduction    Hip adduction    Hip internal rotation    Hip external  rotation    Knee flexion    Knee extension    Ankle dorsiflexion    Ankle plantarflexion    Ankle inversion    Ankle eversion     (Blank rows = not tested)  LOWER EXTREMITY MMT:    MMT Right eval Left eval Right 07/05/23 Left 07/05/23 R 08/02/23 L 08/02/23 R 08/29/23 L 08/29/23  Hip flexion 4 5 4- 4 4+/5 4+/5 5 5   Hip extension 3- 3+ 3- 3- 4-/5 4-/5 3+ 4-  Hip abduction   4- 4-   4-  4-  Hip adduction          Hip internal rotation          Hip external rotation          Knee flexion 4- 4+ 5 5 5/5 5/5 5 5   Knee extension 4+ 5 5 5  5/5 5/5 5 5   Ankle dorsiflexion 4 4 4  3+ 4+/5 4+/5 5 5   Ankle plantarflexion   4 4      Ankle inversion          Ankle eversion           (Blank rows = not tested)  FUNCTIONAL TESTS:  5 times sit to stand: 07/05/23: 13.08 sec from 15.41 sec 2 minute walk test: 07/05/23: 547 ft  08/29/2023: 5 Times Sit to Stand: 10 sec 2 minute walk test: 629 ft    GAIT: Distance walked: 50 ft  in gym Assistive device utilized: None Level of assistance: Modified independence Comments: decreased gait speed  TREATMENT DATE:  08/27/23: PT Re-eval 5xSTS MMT Lumbar ROM Mod Oswestry  POE Hip extension at wall Nustep, level 3, 5 min, LE only   08/24/23 Moist heat to low back x 5' in supine to decrease pain and improve soft tissue extensibility Supine: Abdominal bracing 5" x 10 LTR x 10 Nustep seat 9 x 5' dynamic warm up level 3 Standing  Heel raises 2 x 10 Slant board 5 x 20"  RTB rows x 15   08/14/23 Nustep seat 9 x 5' dynamic warm up Heel raises on incline x 20 Slant board 5 x 20" Hip vectors 2" 2 x 5 each Supine: LTR x 10 TA contraction 5" x 10    PATIENT EDUCATION:  Education details: Patient educated on exam findings, POC, scope of PT, HEP, and what to expect next visit. Person educated: Patient Education method: Explanation, Demonstration, and Handouts Education comprehension: verbalized understanding, returned demonstration,  verbal cues required, and tactile cues required  HOME EXERCISE PROGRAM: 07/03/23 prone hip extension, shoulder extension  Access Code: [email or web address] URL: [email or web address] Date: 06/28/2023  -Supine Lower Trunk Rotation  - 1 x daily - 7 x weekly - 1 sets - 10 reps - Supine Dead Bug with Leg Extension  - 1 x daily - 7 x weekly - 1 sets - 10 reps   Access Code: [email or web address] Date: 06/27/2023 - Supine Transversus Abdominis Bracing - Hands on Stomach  - 1 x daily - 7 x weekly - 1 sets - 10 reps - 5 sec hold - Supine Bridge  - 1 x daily - 7 x weekly - 1 sets - 10 reps   Access Code: [email or web address] URL: [email or web address] Date: 08/29/2023 Prepared by: Fabiola Backer Powell-Butler  Exercises - Prone Press Up On Elbows  - 1 x daily - 7 x weekly - 3 sets - 10 reps - Standing Back Extension  - 1 x daily - 7 x weekly - 3 sets - 10 reps  ASSESSMENT:  CLINICAL IMPRESSION: Progress Note/PT Re-eval performed on this date, with patient demonstrating improvements in LE MMT, 2 min walk, and 5 times sit to stand tests, displaying improvements with LE strength and power. Patient reports continued low back pain with intermittent radiating symptoms to RLE. Patient educated on anatomy and physiology of herniated nucleus pulposus, L5-S1. Patient shown prone on elbows and hip ext at wall to address. Patient reporting increased SOB during 2 min walk test. Patient will benefit from continued skilled physical therapy in order to address the above  deficits in order to improve function and return to PLOF.     Eval:Patient is a 53 y.o. female who was seen today for physical therapy evaluation and treatment for M51.27 (ICD-10-CM) - Herniated nucleus pulposus, L5-S1.  Patient demonstrates muscle weakness, reduced ROM, and fascial restrictions which are likely contributing to symptoms of pain and are negatively impacting patient ability to perform ADLs and functional mobility tasks. Patient will benefit from  skilled physical therapy services to address these deficits to reduce pain and improve level of function with ADLs and functional mobility tasks.   OBJECTIVE IMPAIRMENTS: Abnormal gait, decreased activity tolerance, decreased mobility, decreased ROM, decreased strength, hypomobility, increased fascial restrictions, impaired perceived functional ability, postural dysfunction, and pain.   ACTIVITY LIMITATIONS: carrying, lifting, bending, sitting, standing, sleeping, and locomotion level  PARTICIPATION LIMITATIONS: meal prep, cleaning, laundry, driving, shopping, and community activity  REHAB POTENTIAL: Good    GOALS: Goals reviewed with patient? No  SHORT TERM GOALS: Target date: 09/12/2023  patient will be independent with initial HEP  Baseline: Goal status: MET  2.  Patient will self report 50% improvement to improve tolerance for functional activity  Baseline:  Goal status: IN PROGRESS   LONG TERM GOALS: Target date: 09/26/2023  Patient will be independent in self management strategies to improve quality of life and functional outcomes.  Baseline:  Goal status: in progress  2.  Patient will self report 75% improvement to improve tolerance for functional activity  Baseline:  Goal status: in progress  3.  Patient will increase right leg MMT's to 4+ to 5/5 to allow navigation of steps without gait deviation or loss of balance   Baseline: see above Goal status: MET  4.  Patient will improve 5 times sit to stand score from 15.41 sec to 12 sec or less to demonstrate improved functional mobility and increased leg strength.    Baseline: See above Goal status: MET  PLAN:  PT FREQUENCY: 2x/week  PT DURATION: 4 weeks  PLANNED INTERVENTIONS: 97164- PT Re-evaluation, 97110-Therapeutic exercises, 97530- Therapeutic activity, 97112- Neuromuscular re-education, 97535- Self Care, 40981- Manual therapy, [phone]- Gait training, Patient/Family education, Taping, Dry Needling,  Joint mobilization, Joint manipulation, Spinal manipulation, Spinal mobilization,   PLAN FOR NEXT SESSION:  Continue POC and may progress as tolerated with emphasis on extension activities, core and postural strengthening, body mechanics with functional tasks    2:55 PM, 08/29/23 Peace Noyes Powell-Butler, PT, DPT [facility] [place]

## 2023-09-03 ENCOUNTER — Encounter (HOSPITAL_COMMUNITY)

## 2023-09-05 ENCOUNTER — Encounter (HOSPITAL_COMMUNITY)

## 2023-09-10 ENCOUNTER — Ambulatory Visit (HOSPITAL_COMMUNITY)

## 2023-09-10 DIAGNOSIS — M5459 Other low back pain: Secondary | ICD-10-CM | POA: Diagnosis not present

## 2023-09-10 DIAGNOSIS — M545 Low back pain, unspecified: Secondary | ICD-10-CM

## 2023-09-10 DIAGNOSIS — R262 Difficulty in walking, not elsewhere classified: Secondary | ICD-10-CM

## 2023-09-10 NOTE — Therapy (Signed)
 OUTPATIENT PHYSICAL THERAPY THORACOLUMBAR TREATMENT/Progress Note/Re-eval   Patient Name: Raven Smith MRN: [id number] DOB:08-08-1970, 53 y.o., female Today's Date: 09/10/2023  END OF SESSION:  PT End of Session - 09/10/23 1350     Visit Number 13    Number of Visits 20    Date for PT Re-Evaluation 09/26/23    Authorization Type UHC    Authorization Time Period no auth needed   ref# [id number]   co-pay 25  no max visits    Authorization - Number of Visits 25    Progress Note Due on Visit 20    PT Start Time 1350    PT Stop Time 1430    PT Time Calculation (min) 40 min    Activity Tolerance Patient tolerated treatment well    Behavior During Therapy [facility] for tasks assessed/performed            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   Hyperlipidemia 2014   rx'd zocor ; didn't do   Irregular menstrual bleeding 04/23/2013   Past Surgical History:  Procedure Laterality Date   BIOPSY  02/09/2020   Procedure: BIOPSY;  Surgeon: Vinetta Greening, DO;  [facility];  Service: Endoscopy;;   CHOLECYSTECTOMY     COLONOSCOPY WITH PROPOFOL  N/A 02/09/2020   Procedure: COLONOSCOPY WITH PROPOFOL ;  Surgeon: Vinetta Greening, DO;  [facility];  Service: Endoscopy;  Laterality: N/A;  8:00am   ESOPHAGOGASTRODUODENOSCOPY (EGD) WITH PROPOFOL  N/A 02/09/2020   Procedure: ESOPHAGOGASTRODUODENOSCOPY (EGD) WITH PROPOFOL ;  Surgeon: Vinetta Greening, DO;  [facility];  Service: Endoscopy;  Laterality: N/A;   TUBAL LIGATION     Patient Active Problem List   Diagnosis Date Noted   E. coli urinary tract infection 05/24/2021   Vaginal dryness 04/25/2021   Encounter for screening fecal occult blood testing 04/25/2021   Encounter for gynecological examination with Papanicolaou smear of cervix 04/25/2021   Iron deficiency anemia due to chronic blood loss 03/31/2021   Menorrhagia  with irregular cycle 03/31/2021   Abdominal pain 04/20/2020   GERD (gastroesophageal reflux disease) 01/15/2020   Anemia 01/15/2020   Constipation 01/15/2020   Dysmenorrhea 12/25/2018   Dyspareunia in female 12/25/2018   Chest pain 07/21/2014   Dyspnea 07/21/2014   Palpitations 07/21/2014   Irregular menstrual bleeding 04/23/2013   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    Chronic pain for years but this was definitely worse/more.  No fall or injury that she can attribute it to; saw PCP; did an MRI; referred to Dr. Andy Bannister; got an epidural 05/21/23; was sore for a few weeks; maybe now a little better; just very uncomfortable in the low back; pain with twisting; sometimes sharp.  Pain with bending forward; disturbs sleep  PERTINENT HISTORY:  Her daughter was here for PT  PAIN:  Are you having pain? Yes: NPRS scale: 5/10; highest 9/10 Pain location: low back; middle Pain description: sharp, tight, aching and throbbing Aggravating factors: bending, twisting Relieving factors: Aleve   PRECAUTIONS: None  Right side some N/T; has been going on a few weeks; both knees and feet hurt sometimes; tingling in both feet right much worse than left  WEIGHT BEARING  RESTRICTIONS: No  FALLS:  Has patient fallen in last 6 months? No   PLOF: Independent  PATIENT GOALS: be able to walk, sit and move without pain  NEXT MD VISIT: April 7th, 2025  OBJECTIVE:  Note: Objective measures were completed at Evaluation unless otherwise noted.  DIAGNOSTIC FINDINGS:  IMPRESSION: 1. At L4-5 there is a mild disc bulge with a right foraminal/lateral disc protrusion in close proximity to the right L4 nerve root. Mild right foraminal stenosis. No left foraminal stenosis. Mild bilateral lateral recess narrowing. 2. At L5-S1 there is a mild disc bulge with a small central disc protrusion. Mild bilateral facet arthropathy. 3. No acute osseous injury of the lumbar spine.     Electronically Signed   By: Onnie Bilis M.D.   On: 04/25/2023 12:55  PATIENT SURVEYS:  Modified Oswestry 07/05/23: 23/50 = 46% from 29/50  08/02/23: 23 / 50 = 46.0 %]\  08/29/23: Modified Oswestry Low Back Pain Disability Questionnaire: 31 / 50 = 62.0 %   COGNITION: Overall cognitive status: Within functional limits for tasks assessed     SENSATION: Reports of numbness both feet  POSTURE: decreased lumbar lordosis  PALPATION: Tender L4-S1 paraspinals  LUMBAR ROM:   AROM eval 07/05/23 08/02/23 08/29/23  Flexion Finger tips to mid shin Finger tips to the ankles To midshin To midshin  Extension 25% available 25% available* 25% avail 25% avail   Right lateral flexion      Left lateral flexion      Right rotation      Left rotation       (Blank rows = not tested) *repeated flex/ext in standing did not aggravate/relieve the pain  LOWER EXTREMITY ROM:     Active  Right eval Left eval  Hip flexion    Hip extension    Hip abduction    Hip adduction    Hip internal rotation    Hip external rotation    Knee flexion    Knee extension    Ankle dorsiflexion    Ankle plantarflexion    Ankle inversion    Ankle eversion     (Blank rows = not tested)  LOWER EXTREMITY MMT:     MMT Right eval Left eval Right 07/05/23 Left 07/05/23 R 08/02/23 L 08/02/23 R 08/29/23 L 08/29/23  Hip flexion 4 5 4- 4 4+/5 4+/5 5 5   Hip extension 3- 3+ 3- 3- 4-/5 4-/5 3+ 4-  Hip abduction   4- 4-   4- 4-  Hip adduction          Hip internal rotation  Hip external rotation          Knee flexion 4- 4+ 5 5 5/5 5/5 5 5   Knee extension 4+ 5 5 5  5/5 5/5 5 5   Ankle dorsiflexion 4 4 4  3+ 4+/5 4+/5 5 5   Ankle plantarflexion   4 4      Ankle inversion          Ankle eversion           (Blank rows = not tested)  FUNCTIONAL TESTS:  5 times sit to stand: 07/05/23: 13.08 sec from 15.41 sec 2 minute walk test: 07/05/23: 547 ft  09/10/2023: 5 Times Sit to Stand: 10 sec 2 minute walk test: 629 ft    GAIT: Distance walked: 50 ft  in gym Assistive device utilized: None Level of assistance: Modified independence Comments: decreased gait speed  TREATMENT DATE: 09/10/23 Prone: Prone lying x 5' with moist heat  Prone on elbows x 1' Prone press ups x 10 Manual mobilization grade 2 and 3 x 10 Prone press ups with overpressure x 10 Glute sets 5" hold x 10 Hip extension x 10 Standing hip extension at wall x 10 Lateral lumbar shift x 10     08/27/23: PT Re-eval 5xSTS MMT Lumbar ROM Mod Oswestry  POE Hip extension at wall Nustep, level 3, 5 min, LE only   08/24/23 Moist heat to low back x 5' in supine to decrease pain and improve soft tissue extensibility Supine: Abdominal bracing 5" x 10 LTR x 10 Nustep seat 9 x 5' dynamic warm up level 3 Standing  Heel raises 2 x 10 Slant board 5 x 20"  RTB rows x 15   08/14/23 Nustep seat 9 x 5' dynamic warm up Heel raises on incline x 20 Slant board 5 x 20" Hip vectors 2" 2 x 5 each Supine: LTR x 10 TA contraction 5" x 10    PATIENT EDUCATION:  Education details: Patient educated on exam findings, POC, scope of PT, HEP, and what to expect next visit. Person educated: Patient Education method: Explanation,  Demonstration, and Handouts Education comprehension: verbalized understanding, returned demonstration, verbal cues required, and tactile cues required  HOME EXERCISE PROGRAM: 07/03/23 prone hip extension, shoulder extension  Access Code: [email or web address] URL: [email or web address] Date: 06/28/2023  -Supine Lower Trunk Rotation  - 1 x daily - 7 x weekly - 1 sets - 10 reps - Supine Dead Bug with Leg Extension  - 1 x daily - 7 x weekly - 1 sets - 10 reps   Access Code: [email or web address] Date: 06/27/2023 - Supine Transversus Abdominis Bracing - Hands on Stomach  - 1 x daily - 7 x weekly - 1 sets - 10 reps - 5 sec hold - Supine Bridge  - 1 x daily - 7 x weekly - 1 sets - 10 reps   Access Code: [email or web address] URL: [email or web address] Date: 08/29/2023 Prepared by: Virgia Griffins Powell-Butler  Exercises - Prone Press Up On Elbows  - 1 x daily - 7 x weekly - 3 sets - 10 reps - Standing Back Extension  - 1 x daily - 7 x weekly - 3 sets - 10 reps  ASSESSMENT:  CLINICAL IMPRESSION: Continued with extension bias; added lateral shift at the wall with decreased pain. Continues with limited extension range.  Trial of overpressure wit press ups with minimal increase in range.  Noted tightness right side paraspinals > left.  Patient will benefit from continued skilled physical therapy in order to address the above deficits  in order to improve function and return to PLOF.     Eval:Patient is a 53 y.o. female who was seen today for physical therapy evaluation and treatment for M51.27 (ICD-10-CM) - Herniated nucleus pulposus, L5-S1.  Patient demonstrates muscle weakness, reduced ROM, and fascial restrictions which are likely contributing to symptoms of pain and are negatively impacting patient ability to perform ADLs and functional mobility tasks. Patient will benefit from skilled physical therapy services to address these deficits to reduce pain and improve level of function with ADLs and functional  mobility tasks.   OBJECTIVE IMPAIRMENTS: Abnormal gait, decreased activity tolerance, decreased mobility, decreased ROM, decreased strength, hypomobility, increased fascial restrictions, impaired perceived functional ability, postural dysfunction, and pain.   ACTIVITY LIMITATIONS: carrying, lifting, bending, sitting, standing, sleeping, and locomotion level  PARTICIPATION LIMITATIONS: meal prep, cleaning, laundry, driving, shopping, and community activity  REHAB POTENTIAL: Good    GOALS: Goals reviewed with patient? No  SHORT TERM GOALS: Target date: 09/12/2023  patient will be independent with initial HEP  Baseline: Goal status: MET  2.  Patient will self report 50% improvement to improve tolerance for functional activity  Baseline:  Goal status: IN PROGRESS   LONG TERM GOALS: Target date: 09/26/2023  Patient will be independent in self management strategies to improve quality of life and functional outcomes.  Baseline:  Goal status: in progress  2.  Patient will self report 75% improvement to improve tolerance for functional activity  Baseline:  Goal status: in progress  3.  Patient will increase right leg MMT's to 4+ to 5/5 to allow navigation of steps without gait deviation or loss of balance   Baseline: see above Goal status: MET  4.  Patient will improve 5 times sit to stand score from 15.41 sec to 12 sec or less to demonstrate improved functional mobility and increased leg strength.    Baseline: See above Goal status: MET  PLAN:  PT FREQUENCY: 2x/week  PT DURATION: 4 weeks  PLANNED INTERVENTIONS: 97164- PT Re-evaluation, 97110-Therapeutic exercises, 97530- Therapeutic activity, 97112- Neuromuscular re-education, 97535- Self Care, 21308- Manual therapy, [phone]- Gait training, Patient/Family education, Taping, Dry Needling, Joint mobilization, Joint manipulation, Spinal manipulation, Spinal mobilization,   PLAN FOR NEXT SESSION:  Continue POC and may  progress as tolerated with emphasis on extension activities, core and postural strengthening, body mechanics with functional tasks; consider d/c if no longer improving   2:32 PM, 09/10/23 [place] Small Miquel Stacks MPT [place] [facility] [phone] Ph:[phone]

## 2023-09-12 ENCOUNTER — Encounter (HOSPITAL_COMMUNITY)

## 2023-09-28 ENCOUNTER — Other Ambulatory Visit: Payer: Self-pay | Admitting: Adult Health

## 2023-11-06 ENCOUNTER — Other Ambulatory Visit: Payer: Self-pay | Admitting: Adult Health

## 2023-11-13 ENCOUNTER — Telehealth: Payer: Self-pay | Admitting: *Deleted

## 2023-11-13 NOTE — Telephone Encounter (Signed)
 Pt is on Megace  and it's on back order until August/2025. Pt hasn't been seen since 05/24/21. I spoke with JAG. Pt needs an appt to discuss options. Call transferred to [facility] for appt. JSY

## 2023-11-21 ENCOUNTER — Encounter: Payer: Self-pay | Admitting: Adult Health

## 2023-11-21 ENCOUNTER — Ambulatory Visit: Admitting: Adult Health

## 2023-11-21 VITALS — BP 102/73 | HR 114 | Ht 69.0 in | Wt 176.0 lb

## 2023-11-21 DIAGNOSIS — R3915 Urgency of urination: Secondary | ICD-10-CM

## 2023-11-21 DIAGNOSIS — N941 Unspecified dyspareunia: Secondary | ICD-10-CM

## 2023-11-21 DIAGNOSIS — R3989 Other symptoms and signs involving the genitourinary system: Secondary | ICD-10-CM | POA: Diagnosis not present

## 2023-11-21 DIAGNOSIS — R102 Pelvic and perineal pain: Secondary | ICD-10-CM

## 2023-11-21 DIAGNOSIS — N921 Excessive and frequent menstruation with irregular cycle: Secondary | ICD-10-CM

## 2023-11-21 DIAGNOSIS — R300 Dysuria: Secondary | ICD-10-CM

## 2023-11-21 DIAGNOSIS — Z862 Personal history of diseases of the blood and blood-forming organs and certain disorders involving the immune mechanism: Secondary | ICD-10-CM

## 2023-11-21 LAB — POCT URINALYSIS DIPSTICK
Glucose, UA: NEGATIVE
Nitrite, UA: NEGATIVE
Protein, UA: POSITIVE — AB

## 2023-11-21 MED ORDER — MEGESTROL ACETATE 40 MG/ML PO SUSP: ORAL | 1 refills | Status: DC

## 2023-11-21 NOTE — Progress Notes (Signed)
  Subjective:     Patient ID: Raven Smith, female   DOB: [birth year], 53 y.o.   MRN: [id number]  HPI Demaris is a 53 year old black female,married, [id number] in complaining of heavy periods if not on megace  and has urinary frequency, with some burning and urine dark.  She says she has had pelvic pain for over a year and pain with sex. Declines exam today, is tired, from being at hospital with daughter who had 3 strokes recently. Last period was 6 days with clots.     Component Value Date/Time   DIAGPAP  04/25/2021 1600    - Negative for intraepithelial lesion or malignancy (NILM)   DIAGPAP  12/25/2018 0000    NEGATIVE FOR INTRAEPITHELIAL LESIONS OR MALIGNANCY.   HPVHIGH Negative 04/25/2021 1600   ADEQPAP  04/25/2021 1600    Satisfactory for evaluation. The presence or absence of an   ADEQPAP  04/25/2021 1600    endocervical/transformation zone component cannot be determined because   ADEQPAP of atrophy. 04/25/2021 1600   PCP is [facility]   Review of Systems   +heavy periods if not on megace   + urinary frequency, with some burning and urine dark.   + pelvic pain for over a year + pain with sex Reviewed past medical,surgical, social and family history. Reviewed medications and allergies.  Objective:   Physical Exam BP 102/73 (BP Location: Right Arm, Patient Position: Sitting, Cuff Size: Normal)   Pulse (!) 114   Ht 5' 9 (1.753 m)   Wt 176 lb (79.8 kg)   LMP 11/13/2023 (Approximate)   BMI 25.99 kg/m   urine dipstick 1+ ketones, 2+blood and 2+protein and trace leuks Skin warm and dry.  Lungs: clear to ausculation bilaterally. Cardiovascular: regular rate and rhythm.     Fall risk is low  Upstream - 11/21/23 1544       Pregnancy Intention Screening   Does the patient want to become pregnant in the next year? No    Does the patient's partner want to become pregnant in the next year? No    Would the patient like to discuss contraceptive options today? No      Contraception Wrap  Up   Current Method Female Sterilization    End Method Female Sterilization    Contraception Counseling Provided No          Assessment:     1. Urinary urgency Urine culture sent to rule out UTI  - POCT Urinalysis Dipstick - Urine Culture  2. Abnormal urine color - POCT Urinalysis Dipstick - Urine Culture  3. Burning with urination - POCT Urinalysis Dipstick - Urine Culture  4. Menorrhagia with irregular cycle (Primary) Last period 6 days with clots, heavy when not on megace , but can't find  Will rx megace  Meds ordered this encounter  Medications   megestrol  (MEGACE ) 40 MG/ML suspension    Sig: Take 2 ml daily by mouth    Dispense:  120 mL    Refill:  1    Supervising Provider:   JAYNE MINDER H [[id number]]   Check labs  - CBC - Iron, TIBC and Ferritin Panel  5. Pelvic pain Has had for over a year   6. Dyspareunia in female + pain with sex  7. History of anemia Check labs  - CBC - Iron, TIBC and Ferritin Panel     Plan:     Return in 4 weeks for pap and physical

## 2023-11-22 ENCOUNTER — Ambulatory Visit: Payer: Self-pay | Admitting: Adult Health

## 2023-11-22 LAB — CBC
Hematocrit: 40.3 % (ref 34.0–46.6)
Hemoglobin: 12.8 g/dL (ref 11.1–15.9)
MCH: 27.1 pg (ref 26.6–33.0)
MCHC: 31.8 g/dL (ref 31.5–35.7)
MCV: 85 fL (ref 79–97)
Platelets: 271 x10E3/uL (ref 150–450)
RBC: 4.73 x10E6/uL (ref 3.77–5.28)
RDW: 12.8 % (ref 11.7–15.4)
WBC: 7 x10E3/uL (ref 3.4–10.8)

## 2023-11-22 LAB — IRON,TIBC AND FERRITIN PANEL
Ferritin: 69 ng/mL (ref 15–150)
Iron Saturation: 20 % (ref 15–55)
Iron: 67 ug/dL (ref 27–159)
Total Iron Binding Capacity: 329 ug/dL (ref 250–450)
UIBC: 262 ug/dL (ref 131–425)

## 2023-11-22 NOTE — Telephone Encounter (Signed)
 Pt aware that blood count & iron panel was good. Pt voiced understanding. JSY

## 2023-11-22 NOTE — Telephone Encounter (Signed)
-----   Message from [place] sent at 11/22/2023 12:16 PM EDT ----- Let her know blood count and iron panel was good THX

## 2023-11-24 LAB — URINE CULTURE

## 2023-11-26 NOTE — Telephone Encounter (Signed)
 Pt aware no dominant growth on urine. Advised to drink lots of water . Pt voiced understanding. JSY

## 2023-11-26 NOTE — Telephone Encounter (Signed)
-----   Message from Raven Smith sent at 11/26/2023  1:34 PM EDT ----- Let her know no dominant growth on urine THX

## 2023-12-19 ENCOUNTER — Other Ambulatory Visit: Payer: Self-pay | Admitting: Nurse Practitioner

## 2023-12-19 DIAGNOSIS — E042 Nontoxic multinodular goiter: Secondary | ICD-10-CM

## 2023-12-26 ENCOUNTER — Other Ambulatory Visit

## 2024-01-01 ENCOUNTER — Ambulatory Visit
Admission: RE | Admit: 2024-01-01 | Discharge: 2024-01-01 | Disposition: A | Discharge status: Home / Self Care | Source: Ambulatory Visit | Attending: Nurse Practitioner | Admitting: Nurse Practitioner

## 2024-01-01 DIAGNOSIS — E042 Nontoxic multinodular goiter: Secondary | ICD-10-CM

## 2024-01-07 ENCOUNTER — Ambulatory Visit
Admission: EM | Admit: 2024-01-07 | Discharge: 2024-01-07 | Disposition: A | Discharge status: Home / Self Care | Attending: Nurse Practitioner | Admitting: Nurse Practitioner

## 2024-01-07 DIAGNOSIS — U071 COVID-19: Secondary | ICD-10-CM

## 2024-01-07 DIAGNOSIS — J0100 Acute maxillary sinusitis, unspecified: Secondary | ICD-10-CM

## 2024-01-07 LAB — POC SOFIA SARS ANTIGEN FIA: SARS Coronavirus 2 Ag: POSITIVE — AB

## 2024-01-07 MED ORDER — PROMETHAZINE-DM 6.25-15 MG/5ML PO SYRP: 5.0000 mL | ORAL_SOLUTION | Freq: Four times a day (QID) | ORAL | 0 refills | Status: AC | PRN

## 2024-01-07 MED ORDER — PAXLOVID (300/100) 20 X 150 MG & 10 X 100MG PO TBPK: 3.0000 | ORAL_TABLET | Freq: Two times a day (BID) | ORAL | 0 refills | Status: AC

## 2024-01-07 MED ORDER — FLUTICASONE PROPIONATE 50 MCG/ACT NA SUSP: 2.0000 | Freq: Every day | NASAL | 0 refills | Status: AC

## 2024-01-07 MED ORDER — AMOXICILLIN-POT CLAVULANATE 875-125 MG PO TABS: 1.0000 | ORAL_TABLET | Freq: Two times a day (BID) | ORAL | 0 refills | Status: AC

## 2024-01-07 NOTE — Discharge Instructions (Addendum)
 Your COVID test was positive today. Take medication as prescribed. Increase fluids and allow for plenty of rest. You may take over-the-counter Tylenol  or ibuprofen  as needed for pain, fever, general discomfort. You may use normal saline nasal spray throughout the day for nasal congestion or runny nose. For your cough, recommend use of a humidifier in your bedroom at nighttime during sleep and sleeping elevated on pillows while symptoms persist. While you are taking the medication Paxlovid , continue to wear your mask.  When she complete the medication, if you continue to experience symptoms, continue to wear your mask for an additional 5 days. If you develop a fever, you will need to remain home until you have been fever free for 24 hours with no medication. Go to the emergency department immediately if you experience shortness of breath, difficulty breathing, or other concerns. Follow-up as needed.

## 2024-01-07 NOTE — ED Triage Notes (Signed)
 Patient presents to office for body aches, cough, sinus pressure, bilateral ear pain started 1 week ago. +HOME COVID TEST competed 2 nights ago.

## 2024-01-07 NOTE — ED Provider Notes (Signed)
 [facility]    CSN: [id number] Arrival date & time: 01/07/24  0910      History   Chief Complaint Chief Complaint  Patient presents with   Ear Pain   Cough   Generalized Body Aches   Shortness of Breath    HPI Raven Smith is a 53 y.o. female.   The history is provided by the patient.   Patient presents with a 1 week history of sinus pressure, bilateral ear pain, nasal congestion, and postnasal drainage.  Patient states 2 days ago, she developed body aches, chills, cough and generalized fatigue.  Denies fever, ear drainage, wheezing, difficulty breathing, chest pain, abdominal pain, nausea, vomiting, diarrhea, or rash.  Patient states she did go to a urgent care with her daughter, and she encountered several people there that were sick.  States that she took a home COVID test 2 days ago which was positive.  So far she has been using over-the-counter medications with minimal relief of her symptoms.  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 coli urinary tract infection 05/24/2021   Vaginal dryness 04/25/2021   Encounter for screening fecal occult blood testing 04/25/2021   Encounter for gynecological examination with Papanicolaou smear of cervix 04/25/2021   Iron deficiency anemia due to chronic blood loss 03/31/2021   Menorrhagia with irregular cycle 03/31/2021   Abdominal pain 04/20/2020   GERD (gastroesophageal reflux disease) 01/15/2020   Anemia 01/15/2020   Constipation 01/15/2020   Dysmenorrhea 12/25/2018   Dyspareunia in female 12/25/2018   Chest pain 07/21/2014    Dyspnea 07/21/2014   Palpitations 07/21/2014   Irregular menstrual bleeding 04/23/2013   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   Hyperlipidemia 2014    Past Surgical History:  Procedure Laterality Date   BIOPSY  02/09/2020   Procedure: BIOPSY;  Surgeon: Cindie Carlin POUR, DO;  Location: [facility];  Service: Endoscopy;;   CHOLECYSTECTOMY     COLONOSCOPY WITH PROPOFOL  N/A 02/09/2020   Procedure: COLONOSCOPY WITH PROPOFOL ;  Surgeon: Cindie Carlin POUR, DO;  Location: [facility];  Service: Endoscopy;  Laterality: N/A;  8:00am   ESOPHAGOGASTRODUODENOSCOPY (EGD) WITH PROPOFOL  N/A 02/09/2020   Procedure: ESOPHAGOGASTRODUODENOSCOPY (EGD) WITH PROPOFOL ;  Surgeon: Cindie Carlin POUR, DO;  Location: [facility];  Service: Endoscopy;  Laterality: N/A;   TUBAL LIGATION      OB History     Gravida  4   Para  2   Term      Preterm  2   AB  2   Living  1      SAB  2   IAB      Ectopic      Multiple      Live Births  2            Home Medications    Prior to Admission medications   Medication Sig Start Date End Date Taking? Authorizing Provider  acetaminophen  (TYLENOL ) 500 MG tablet Take 500 mg by mouth every 6 (six) hours as needed.   Yes [provider]  amoxicillin -clavulanate (AUGMENTIN ) 875-125 MG tablet Take 1 tablet by mouth every 12 (twelve) hours. 01/07/24  Yes Leath-Warren, Etta PARAS, NP  cyclobenzaprine  (FLEXERIL ) 10 MG tablet Take 10 mg by mouth 3 (three) times daily as needed for muscle spasms. 12/28/20  Yes [provider]  ferrous sulfate  325 (65 FE) MG tablet Take 1 tablet (325 mg total) by mouth daily. 11/02/20  Yes Dean Clarity, MD  fluticasone  (FLONASE ) 50 MCG/ACT nasal spray Place 2 sprays into both nostrils daily. 01/07/24  Yes Leath-Warren, Etta PARAS, NP  loratadine (CLARITIN) 10 MG tablet Take 10 mg by mouth daily as needed for allergies.   Yes [provider]  megestrol  (MEGACE ) 40 MG/ML suspension Take 2 ml daily by  mouth 11/21/23  Yes Signa Delon LABOR, NP  nirmatrelvir/ritonavir (PAXLOVID , 300/100,) 20 x 150 MG & 10 x 100MG  TBPK Take 3 tablets by mouth 2 (two) times daily for 5 days. Patient GFR is > 60. Take nirmatrelvir (150 mg) two tablets twice daily for 5 days and ritonavir (100 mg) one tablet twice daily for 5 days. 01/07/24 01/12/24 Yes Leath-Warren, Etta PARAS, NP  pantoprazole  (PROTONIX ) 40 MG tablet TAKE 1 TABLET(40 MG) BY MOUTH DAILY 05/24/21  Yes Shirlean Therisa ORN, NP  promethazine -dextromethorphan (PROMETHAZINE -DM) 6.25-15 MG/5ML syrup Take 5 mLs by mouth 4 (four) times daily as needed. 01/07/24  Yes Leath-Warren, Etta PARAS, NP    Family History Family History  Problem Relation Age of Onset   Diabetes Maternal Grandmother    Heart failure Maternal Grandmother    Diabetes Father    Fibroids Mother    Diabetes Mother    Stroke Daughter        X 3   Other Son        MVA   Colon cancer Neg Hx    Gastric cancer Neg Hx     Social History Social History   Tobacco Use   Smoking status: Never   Smokeless tobacco: Never  Vaping Use   Vaping status: Never Used  Substance Use Topics   Alcohol use: Yes    Comment: occasional   Drug use: No     Allergies   Patient has no known allergies.   Review of Systems Review of Systems Per HPI  Physical Exam Triage Vital Signs ED Triage Vitals  Encounter Vitals Group     BP 01/07/24 1036 (!) 140/94     Girls Systolic BP Percentile --      Girls Diastolic BP Percentile --      Boys Systolic BP Percentile --      Boys Diastolic BP Percentile --      Pulse Rate 01/07/24 1036 89     Resp 01/07/24 1036 18     Temp 01/07/24 1036 98.3 F (36.8 C)     Temp Source 01/07/24 1036 Oral     SpO2 01/07/24 1036 100 %     Weight --      Height --      Head Circumference --      Peak Flow --      Pain Score 01/07/24 1037 4     Pain Loc --      Pain Education --      Exclude from Growth Chart --    No data found.  Updated Vital Signs BP (!)  140/94 (BP Location: Left Arm)   Pulse 89   Temp 98.3 F (36.8 C) (Oral)   Resp 18   LMP 01/07/2024  SpO2 100%   Visual Acuity Right Eye Distance:   Left Eye Distance:   Bilateral Distance:    Right Eye Near:   Left Eye Near:    Bilateral Near:     Physical Exam Vitals and nursing note reviewed.  Constitutional:      General: She is not in acute distress.    Appearance: Normal appearance.  HENT:     Head: Normocephalic.     Right Ear: Tympanic membrane, ear canal and external ear normal.     Left Ear: Tympanic membrane, ear canal and external ear normal.     Nose: Congestion present.     Right Turbinates: Enlarged and swollen.     Left Turbinates: Enlarged and swollen.     Right Sinus: Maxillary sinus tenderness and frontal sinus tenderness present.     Left Sinus: Maxillary sinus tenderness and frontal sinus tenderness present.     Mouth/Throat:     Lips: Pink.     Mouth: Mucous membranes are moist.     Pharynx: Postnasal drip present. No pharyngeal swelling, oropharyngeal exudate, posterior oropharyngeal erythema or uvula swelling.     Comments: Cobblestoning present to posterior oropharynx  Eyes:     Extraocular Movements: Extraocular movements intact.     Conjunctiva/sclera: Conjunctivae normal.     Pupils: Pupils are equal, round, and reactive to light.  Cardiovascular:     Rate and Rhythm: Normal rate and regular rhythm.     Pulses: Normal pulses.     Heart sounds: Normal heart sounds.  Pulmonary:     Effort: Pulmonary effort is normal. No respiratory distress.     Breath sounds: Normal breath sounds. No stridor. No wheezing, rhonchi or rales.  Abdominal:     General: Bowel sounds are normal.     Palpations: Abdomen is soft.     Tenderness: There is no abdominal tenderness.  Musculoskeletal:     Cervical back: Normal range of motion.  Lymphadenopathy:     Cervical: No cervical adenopathy.  Skin:    General: Skin is warm and dry.  Neurological:      General: No focal deficit present.     Mental Status: She is alert and oriented to person, place, and time.  Psychiatric:        Mood and Affect: Mood normal.        Behavior: Behavior normal.      UC Treatments / Results  Labs (all labs ordered are listed, but only abnormal results are displayed) Labs Reviewed  POC SOFIA SARS ANTIGEN FIA - Abnormal; Notable for the following components:      Result Value   SARS Coronavirus 2 Ag Positive (*)    All other components within normal limits    EKG   Radiology No results found.  Procedures Procedures (including critical care time)  Medications Ordered in UC Medications - No data to display  Initial Impression / Assessment and Plan / UC Course  I have reviewed the triage vital signs and the nursing notes.  Pertinent labs & imaging results that were available during my care of the patient were reviewed by me and considered in my medical decision making (see chart for details).  COVID test was positive.  On exam, patient with maxillary sinus tenderness.  She has been dealing with sinus pressure and tenderness for more than 1 week.  Will treat empirically with Augmentin  875/125 mg for acute maxillary sinusitis.  Symptomatic treatment provided with Promethazine  DM for the cough, and  fluticasone  50 micro nasal spray for nasal congestion.  For COVID, we will also start patient on Paxlovid .  Supportive care recommendations were provided discussed with the patient to include over-the-counter analgesics, normal saline nasal spray, fluids, and rest.  Discussed indications with patient regarding follow-up.  Patient was in agreement with this plan of care and verbalizes understanding.  All questions were answered.  Patient stable for discharge.   Final Clinical Impressions(s) / UC Diagnoses   Final diagnoses:  Acute maxillary sinusitis, recurrence not specified  COVID     Discharge Instructions      Your COVID test was positive  today. Take medication as prescribed. Increase fluids and allow for plenty of rest. You may take over-the-counter Tylenol  or ibuprofen  as needed for pain, fever, general discomfort. You may use normal saline nasal spray throughout the day for nasal congestion or runny nose. For your cough, recommend use of a humidifier in your bedroom at nighttime during sleep and sleeping elevated on pillows while symptoms persist. While you are taking the medication Paxlovid , continue to wear your mask.  When she complete the medication, if you continue to experience symptoms, continue to wear your mask for an additional 5 days. If you develop a fever, you will need to remain home until you have been fever free for 24 hours with no medication. Go to the emergency department immediately if you experience shortness of breath, difficulty breathing, or other concerns. Follow-up as needed.     ED Prescriptions     Medication Sig Dispense Auth. Provider   nirmatrelvir/ritonavir (PAXLOVID , 300/100,) 20 x 150 MG & 10 x 100MG  TBPK Take 3 tablets by mouth 2 (two) times daily for 5 days. Patient GFR is > 60. Take nirmatrelvir (150 mg) two tablets twice daily for 5 days and ritonavir (100 mg) one tablet twice daily for 5 days. 30 tablet Leath-Warren, Etta PARAS, NP   amoxicillin -clavulanate (AUGMENTIN ) 875-125 MG tablet Take 1 tablet by mouth every 12 (twelve) hours. 14 tablet Leath-Warren, Etta PARAS, NP   fluticasone  (FLONASE ) 50 MCG/ACT nasal spray Place 2 sprays into both nostrils daily. 16 g Leath-Warren, Etta PARAS, NP   promethazine -dextromethorphan (PROMETHAZINE -DM) 6.25-15 MG/5ML syrup Take 5 mLs by mouth 4 (four) times daily as needed. 118 mL Leath-Warren, Etta PARAS, NP      PDMP not reviewed this encounter.   [place] Etta PARAS, NP 01/07/24 1103

## 2024-01-21 ENCOUNTER — Other Ambulatory Visit (HOSPITAL_COMMUNITY)
Admission: RE | Admit: 2024-01-21 | Discharge: 2024-01-21 | Disposition: A | Discharge status: Home / Self Care | Source: Ambulatory Visit | Attending: Adult Health | Admitting: Adult Health

## 2024-01-21 ENCOUNTER — Encounter: Payer: Self-pay | Admitting: Adult Health

## 2024-01-21 ENCOUNTER — Ambulatory Visit (INDEPENDENT_AMBULATORY_CARE_PROVIDER_SITE_OTHER): Admitting: Adult Health

## 2024-01-21 VITALS — BP 117/81 | HR 103 | Ht 69.0 in | Wt 177.5 lb

## 2024-01-21 DIAGNOSIS — Z01419 Encounter for gynecological examination (general) (routine) without abnormal findings: Secondary | ICD-10-CM

## 2024-01-21 DIAGNOSIS — Z13220 Encounter for screening for lipoid disorders: Secondary | ICD-10-CM

## 2024-01-21 DIAGNOSIS — N941 Unspecified dyspareunia: Secondary | ICD-10-CM | POA: Diagnosis not present

## 2024-01-21 DIAGNOSIS — R6882 Decreased libido: Secondary | ICD-10-CM

## 2024-01-21 DIAGNOSIS — Z1231 Encounter for screening mammogram for malignant neoplasm of breast: Secondary | ICD-10-CM | POA: Insufficient documentation

## 2024-01-21 DIAGNOSIS — N921 Excessive and frequent menstruation with irregular cycle: Secondary | ICD-10-CM

## 2024-01-21 DIAGNOSIS — I498 Other specified cardiac arrhythmias: Secondary | ICD-10-CM

## 2024-01-21 NOTE — Progress Notes (Signed)
 Patient ID: Raven Smith, female   DOB: [birth year], 53 y.o.   MRN: [id number] History of Present Illness: Raven Smith is a 53 year old black female,married, [id number] in for a well woman gyn exam and pap. Still spots some, on megace  liquid. Period not as heavy when takes megace   She is caring at her home for daughter that had several strokes recently.  PCP is [facility]  Current Medications, Allergies, Past Medical History, Past Surgical History, Family History and Social History were reviewed in [employer] record.     Review of Systems: Patient denies any headaches, hearing loss, fatigue, blurred vision, shortness of breath, abdominal pain, problems with bowel movements, urination, or intercourse(has pain at times). No joint pain or mood swings.  Spotting on megace  Has had some sharp chest pains and flutters at times, did see PCP in past Has decreased libido   Physical Exam:BP 117/81 (BP Location: Left Arm, Patient Position: Sitting, Cuff Size: Normal)   Pulse (!) 103   Ht 5' 9 (1.753 m)   Wt 177 lb 8 oz (80.5 kg)   LMP 01/07/2024   BMI 26.21 kg/m   General:  Well developed, well nourished, no acute distress Skin:  Warm and dry Neck:  Midline trachea, enlarged thyroid (known cysts), good ROM, no lymphadenopathy Lungs; Clear to auscultation bilaterally Breast:  No dominant palpable mass, retraction, or nipple discharge Cardiovascular: Regular rate and rhythm Abdomen:  Soft, non tender, no hepatosplenomegaly Pelvic:  External genitalia is normal in appearance, no lesions.  The vagina is normal in appearance, +blood. Urethra has no lesions or masses. The cervix is bulbous,pap with HR HPV genotyping performed.  Uterus is felt to be normal size, shape, and contour.  No adnexal masses or tenderness noted.Bladder is non tender, no masses felt. Rectal: Deferred Extremities/musculoskeletal:  No swelling or varicosities noted, no clubbing or cyanosis Psych:  No mood  changes, alert and cooperative,seems happy AA is 51    01/21/2024    3:43 PM 04/25/2021    3:31 PM 12/25/2018   10:25 AM  Depression screen PHQ 2/9  Decreased Interest 2 2 3   Down, Depressed, Hopeless 2 2 3   PHQ - 2 Score 4 4 6   Altered sleeping 2 2 2   Tired, decreased energy 2 3 3   Change in appetite 2 2 3   Feeling bad or failure about yourself  2 2 2   Trouble concentrating 1 1 1   Moving slowly or fidgety/restless 0 1 1  Suicidal thoughts 0 0 0  PHQ-9 Score 13 15 18        01/21/2024    3:45 PM 04/25/2021    3:32 PM  GAD 7 : Generalized Anxiety Score  Nervous, Anxious, on Edge 1 2  Control/stop worrying 1 2  Worry too much - different things 2 2  Trouble relaxing 1 2  Restless 1 1  Easily annoyed or irritable 1 1  Afraid - awful might happen 1 1  Total GAD 7 Score 8 11      [facility] - 01/21/24 1539       Pregnancy Intention Screening   Does the patient want to become pregnant in the next year? No    Does the patient's partner want to become pregnant in the next year? No    Would the patient like to discuss contraceptive options today? No      Contraception Wrap Up   Current Method Female Sterilization    End Method Female Sterilization    Contraception  Counseling Provided No         Examination chaperoned by Clarita Salt LPN   Impression and plan: 1. Encounter for gynecological examination with Papanicolaou smear of cervix (Primary) Pap sent Pap in 3 years if normal Physical in 1 year Will check labs  - Cytology - PAP( [place]) - CBC - Comprehensive metabolic panel with GFR - Lipid panel  2. Menorrhagia with irregular cycle Will get pelvic US  to assess uterus and ovaries at [facility] 01/24/24 at 1:15 pm - US  PELVIC COMPLETE WITH TRANSVAGINAL; Future Has megace  with refill Will talk when US  resulted   3. Dyspareunia in female Has pain with sex at times   4. Decreased libido  5. Screening mammogram for breast cancer Mammogram scheduled for her 02/11/24  at 4 pm at [facility] - MM 3D SCREENING MAMMOGRAM BILATERAL BREAST; Future  6. Periodic heart flutter Will check thyroid  and refer to cardiology  - TSH + free T4 - Ambulatory referral to Cardiology  7. Screening cholesterol level - Lipid panel

## 2024-01-23 LAB — CYTOLOGY - PAP
Comment: NEGATIVE
Diagnosis: NEGATIVE
High risk HPV: NEGATIVE

## 2024-01-24 ENCOUNTER — Ambulatory Visit (HOSPITAL_COMMUNITY): Admission: RE | Admit: 2024-01-24 | Source: Ambulatory Visit

## 2024-01-24 ENCOUNTER — Ambulatory Visit: Payer: Self-pay | Admitting: Adult Health

## 2024-01-24 NOTE — Telephone Encounter (Signed)
 Pt aware pap was normal and recommend recheck in 3 years. Pt voiced understanding. JSY

## 2024-01-24 NOTE — Telephone Encounter (Signed)
-----   Message from Delon Lewis sent at 01/24/2024  8:26 AM EDT ----- Let her know pap was normal THX

## 2024-02-11 ENCOUNTER — Ambulatory Visit (HOSPITAL_COMMUNITY)
Admission: RE | Admit: 2024-02-11 | Discharge: 2024-02-11 | Disposition: A | Discharge status: Home / Self Care | Source: Ambulatory Visit | Attending: Adult Health | Admitting: Adult Health

## 2024-02-11 DIAGNOSIS — Z1231 Encounter for screening mammogram for malignant neoplasm of breast: Secondary | ICD-10-CM | POA: Insufficient documentation

## 2024-02-14 ENCOUNTER — Other Ambulatory Visit (HOSPITAL_COMMUNITY): Payer: Self-pay | Admitting: Adult Health

## 2024-02-14 DIAGNOSIS — R928 Other abnormal and inconclusive findings on diagnostic imaging of breast: Secondary | ICD-10-CM

## 2024-02-14 NOTE — Telephone Encounter (Signed)
 Pt aware of mammogram results and needs to have more imaging. Advised pt she should hear back from xray or she can call to schedule appt. Gave number. Pt voiced understanding. JSY

## 2024-02-14 NOTE — Telephone Encounter (Signed)
-----   Message from Delon Lewis sent at 02/14/2024  8:29 AM EDT ----- Please call her about mammogram [facility]

## 2024-02-19 ENCOUNTER — Ambulatory Visit (HOSPITAL_COMMUNITY)
Admission: RE | Admit: 2024-02-19 | Discharge: 2024-02-19 | Disposition: A | Source: Ambulatory Visit | Attending: Adult Health | Admitting: Adult Health

## 2024-02-19 ENCOUNTER — Ambulatory Visit (HOSPITAL_COMMUNITY)
Admission: RE | Admit: 2024-02-19 | Discharge: 2024-02-19 | Disposition: A | Discharge status: Home / Self Care | Source: Ambulatory Visit | Attending: Adult Health | Admitting: Adult Health

## 2024-02-19 ENCOUNTER — Encounter (HOSPITAL_COMMUNITY): Payer: Self-pay

## 2024-02-19 DIAGNOSIS — R928 Other abnormal and inconclusive findings on diagnostic imaging of breast: Secondary | ICD-10-CM | POA: Diagnosis present

## 2024-02-20 ENCOUNTER — Ambulatory Visit: Payer: Self-pay | Admitting: Adult Health

## 2024-02-20 NOTE — Telephone Encounter (Signed)
-----   Message from Delon Lewis sent at 02/20/2024  9:56 AM EDT ----- Let her know about mammogram THX

## 2024-02-20 NOTE — Telephone Encounter (Signed)
 Pt aware the right mammogram was normal. Get screening in 1 year. Pt voiced understanding. JSY

## 2024-02-29 ENCOUNTER — Other Ambulatory Visit: Payer: Self-pay

## 2024-02-29 ENCOUNTER — Telehealth: Payer: Self-pay | Admitting: Adult Health

## 2024-02-29 ENCOUNTER — Telehealth: Payer: Self-pay

## 2024-02-29 DIAGNOSIS — N921 Excessive and frequent menstruation with irregular cycle: Secondary | ICD-10-CM

## 2024-02-29 MED ORDER — MEGESTROL ACETATE 40 MG/ML PO SUSP: ORAL | 1 refills | Status: DC

## 2024-02-29 NOTE — Telephone Encounter (Signed)
 Patient wants refill to be sent to [facility] in liquid form and if she is able to find pill form she will let us  know.

## 2024-02-29 NOTE — Telephone Encounter (Signed)
 Patient calling in regards to a rx refill and wants to know if a nurse would call her back.

## 2024-02-29 NOTE — Telephone Encounter (Signed)
 Rn called patient regarding refill on Megace . Mychart indicates refills still available for liquid form. Patient wanted to switch back to pill form and RN educated patient that we do not know what pharmacy has what available but she would have to call her preferred pharmacy to see if the pill is available. Patient to call back with which pharmacy and form she wants the medication to go to.

## 2024-03-06 ENCOUNTER — Telehealth: Payer: Self-pay

## 2024-03-06 MED ORDER — MEGESTROL ACETATE 40 MG PO TABS: 40.0000 mg | ORAL_TABLET | Freq: Two times a day (BID) | ORAL | 2 refills | Status: DC

## 2024-03-06 NOTE — Telephone Encounter (Signed)
 Patient went to pick up megace  suspension at [facility] and they do not have the medication due to backorder. Patient found that [facility] has pill form and wants to now switch her liquid megace  to pill form and have it sent to [facility]. Wants a two or more month supply sent in due to high demand of the medication

## 2024-03-06 NOTE — Telephone Encounter (Signed)
 Pt had to cancel US  but will call and get rescheduled, and she needs Megace  refilled and wants pills and says [facility] has pills now. Refill sent

## 2024-03-06 NOTE — Addendum Note (Signed)
 Addended by: Kingsley Herandez A on: 03/06/2024 05:17 PM   Modules accepted: Orders

## 2024-03-18 ENCOUNTER — Other Ambulatory Visit: Payer: Self-pay | Admitting: Adult Health

## 2024-04-09 ENCOUNTER — Ambulatory Visit

## 2024-04-09 ENCOUNTER — Encounter: Payer: Self-pay | Admitting: Internal Medicine

## 2024-04-09 ENCOUNTER — Ambulatory Visit: Attending: Internal Medicine | Admitting: Internal Medicine

## 2024-04-09 VITALS — BP 127/82 | HR 88 | Ht 69.0 in | Wt 176.6 lb

## 2024-04-09 DIAGNOSIS — R0683 Snoring: Secondary | ICD-10-CM

## 2024-04-09 DIAGNOSIS — I498 Other specified cardiac arrhythmias: Secondary | ICD-10-CM

## 2024-04-09 DIAGNOSIS — G4719 Other hypersomnia: Secondary | ICD-10-CM

## 2024-04-09 DIAGNOSIS — R079 Chest pain, unspecified: Secondary | ICD-10-CM | POA: Diagnosis not present

## 2024-04-09 DIAGNOSIS — R002 Palpitations: Secondary | ICD-10-CM

## 2024-04-09 NOTE — Patient Instructions (Signed)
 Medication Instructions:  Your physician recommends that you continue on your current medications as directed. Please refer to the Current Medication list given to you today.  *If you need a refill on your cardiac medications before your next appointment, please call your pharmacy*  Lab Work: None.  If you have labs (blood work) drawn today and your tests are completely normal, you will receive your results only by: MyChart Message (if you have MyChart) OR A paper copy in the mail If you have any lab test that is abnormal or we need to change your treatment, we will call you to review the results.  Testing/Procedures: You are scheduled for an Exercise Stress Test on ________ at _________  Please arrive 15 minutes prior to your appointment time for registration and insurance purposes.  The test will take approximately 45 minutes to complete.  How to prepare for your Exercise Stress Test: Do bring a list of your current medications with you.  If not listed below, you may take your medications as normal. Do wear comfortable clothes (no dresses or overalls) and walking shoes, tennis shoes preferred (no heels or open toed shoes are allowed) Do Not wear cologne, perfume, aftershave or lotions (deodorant is allowed). Please report to [address] for your test.  If these instructions are not followed, your test will have to be rescheduled.  If you have questions or concerns about your appointment, you can call the Stress Lab at [phone].  If you cannot keep your appointment, please provide 24 hours notification to the Stress Lab, to avoid a possible $50 charge to your account.   WatchPAT? is an FDA-cleared portable home sleep study test that uses a watch and 3 points of contact to monitor 7 different channels, including your heart rate, oxygen saturation, body position, snoring, and chest motion.  The study is easy to use from the comfort of your own home and accurately detect  sleep apnea.  Before bed, you attach the chest sensor, attached the sleep apnea bracelet to your nondominant hand, and attach the finger probe.  After the study, the raw data is downloaded from the watch and scored for apnea events.   For more information: [email or web address]  Patient Testing Instructions:  Once our office has received insurance approval for you to complete this test, we will contact you with a PIN to activate the device.  This typically takes 2-3 weeks.  Please do not open the box until approved.   Do not put battery into the device until bedtime when you are ready to begin the test. Please call the support number if you need assistance after following the instructions below: 24 hour support line- [phone] or [facility] support at [phone] (option 2)  Download the [facility] WatchPAT One app through the [employer] or [employer]. Be sure to turn on or enable access to Bluetooth in settings on your smartphone. Make sure no other Bluetooth devices are on and within the vicinity of your smartphone and WatchPAT watch during testing.  Make sure to leave your smart phone plugged in and charging all night.  When ready for bed:  Follow the instructions step by step in the WatchPAT One app to activate the testing device. For additional instructions, including video instruction, visit the WatchPAT One video on Youtube. You can search for WatchPAT One within Youtube (video is 4 minutes and 18 seconds) or enter: https://youtube/watch?[email or web address]_vbiwxE Please note: You will be prompted to enter a PIN to connect via Bluetooth when  starting the test. The PIN will be assigned to you after insurance has approved the test.  The device is disposable, but it recommended that you retain the device until you receive a call letting you know the study has been received and the results have been interpreted.  We will let you know if the study did not transmit to us  properly  after the test is completed. You do not need to call us  to confirm the receipt of the test.  Please complete the test within 48 hours of receiving PIN.   Frequently Asked Questions:  What is Watch PAT One?  A single use, fully disposable home sleep apnea testing device and will not need to be returned after completion.  What are the requirements to use WatchPAT One?  A successful WatchPAT One sleep study requires a WatchPAT One device, your smart phone, WatchPAT One app, your PIN number, and internet access. What type of phone do I need?  You should have a smart phone that uses Android 5.1 and above or any iPhone with IOS 10 and above. How can I download the WatchPAT one app?  Based on your device type search for WatchPAT One app either in [employer] for [employer] or [employer] for [employer]. Where will I get my PIN for the study?  Your PIN will be provided by your physician's office after insurance has approved the test. This process typically takes 2-3 weeks. It is used for authentication and if you lose/forget your PIN, please reach out to your provider's office.  I do not have internet at home. Can I still complete a WatchPAT One study?  WatchPAT One needs internet connection throughout the night to be able to transmit the sleep data. You can use your home/local internet or your cellular data package. However, it is always recommended to use home/local internet. It is estimated that between 20MB-30MB of data will be used with each study, but the application will be looking for space in the phone to start the study.  What happens if I lose internet or Bluetooth connection?  During the internet disconnection, your phone will not be able to transmit the sleep data.  All the data, will be stored in your phone.  As soon as the internet connection is back on, the phone will resume sending the sleep data. During the Bluetooth disconnection, WatchPAT One will not be able to to send  the sleep data to your phone.  Data will be kept in the WatchPAT One until both devices have Bluetooth connection back on.  As soon as the connection is back on, WatchPAT one will send the sleep data to the phone.  How long do I need to wear the WatchPAT one?  After you start the study, you should wear the device at least 6 hours.  How far should I keep my phone from the device?  During the night, your phone should remain within 15 feet of where you sleep.  What happens if I leave the room for restroom or other reasons?  Leaving the room for any reason will not cause any problem. As soon as your get back to the room, both devices will reconnect and will continue to send the sleep data. Can I use my phone during the sleep study?  Yes, you can use your phone as usual during the study. But it is recommended to put your WatchPAT One on when you are ready to go to bed.  How  will I get my study results?  A soon as you completed your study, your sleep data will be sent to the provider. They will then share the results with you when they are ready.   ZIO XT- Long Term Monitor Instructions  Your physician has requested you wear a ZIO patch monitor for 14 days.  This is a single patch monitor. [employer] supplies one patch monitor per enrollment. Additional stickers are not available. Please do not apply patch if you will be having a Nuclear Stress Test,  Echocardiogram, Cardiac CT, MRI, or Chest Xray during the period you would be wearing the  monitor. The patch cannot be worn during these tests. You cannot remove and re-apply the  ZIO XT patch monitor.  Your ZIO patch monitor will be mailed 3 day USPS to your address on file. It may take 3-5 days  to receive your monitor after you have been enrolled.  Once you have received your monitor, please review the enclosed instructions. Your monitor  has already been registered assigning a specific monitor serial # to you.  Billing and Patient Assistance  Program Information  We have supplied [employer] with any of your insurance information on file for billing purposes. [employer] offers a sliding scale Patient Assistance Program for patients that do not have  insurance, or whose insurance does not completely cover the cost of the ZIO monitor.  You must apply for the Patient Assistance Program to qualify for this discounted rate.  To apply, please call [employer] at [phone], select option 4, select option 2, ask to apply for  Patient Assistance Program. Meredeth will ask your household income, and how many people  are in your household. They will quote your out-of-pocket cost based on that information.  [employer] will also be able to set up a 59-month, interest-free payment plan if needed.  Applying the monitor   Shave hair from upper left chest.  Hold abrader disc by orange tab. Rub abrader in 40 strokes over the upper left chest as  indicated in your monitor instructions.  Clean area with 4 enclosed alcohol pads. Let dry.  Apply patch as indicated in monitor instructions. Patch will be placed under collarbone on left  side of chest with arrow pointing upward.  Rub patch adhesive wings for 2 minutes. Remove white label marked 1. Remove the white  label marked 2. Rub patch adhesive wings for 2 additional minutes.  While looking in a mirror, press and release button in center of patch. A small green light will  flash 3-4 times. This will be your only indicator that the monitor has been turned on.  Do not shower for the first 24 hours. You may shower after the first 24 hours.  Press the button if you feel a symptom. You will hear a small click. Record Date, Time and  Symptom in the Patient Logbook.  When you are ready to remove the patch, follow instructions on the last 2 pages of Patient  Logbook. Stick patch monitor onto the last page of Patient Logbook.  Place Patient Logbook in the blue and white box. Use locking tab on box and tape box  closed  securely. The blue and white box has prepaid postage on it. Please place it in the mailbox as  soon as possible. Your physician should have your test results approximately 7 days after the  monitor has been mailed back to [facility].  Call [facility] Customer Care at [phone] if you have questions regarding  your  ZIO XT patch monitor. Call them immediately if you see an orange light blinking on your  monitor.  If your monitor falls off in less than 4 days, contact our Monitor department at [phone].  If your monitor becomes loose or falls off after 4 days call [employer] at [phone] for  suggestions on securing your monitor   Follow-Up: At [facility], you and your health needs are our priority.  As part of our continuing mission to provide you with exceptional heart care, our providers are all part of one team.  This team includes your primary Cardiologist (physician) and Advanced Practice Providers or APPs (Physician Assistants and Nurse Practitioners) who all work together to provide you with the care you need, when you need it.  Your next appointment:   2-3 month(s)  Provider:   Dr. LOIS Maxcy, MD   We recommend signing up for the patient portal called MyChart.  Sign up information is provided on this After Visit Summary.  MyChart is used to connect with patients for Virtual Visits (Telemedicine).  Patients are able to view lab/test results, encounter notes, upcoming appointments, etc.  Non-urgent messages can be sent to your provider as well.   To learn more about what you can do with MyChart, go to [email or web address].

## 2024-04-09 NOTE — Progress Notes (Signed)
 OFFICE NOTE  Chief Complaint:  Chest pain, palpitations, dyspnea on exertion  Primary Care Physician: [facility]  HPI:  Raven Smith is a pleasant 53 year old female with few previous medical problems. She has a history of low back pain and radicular symptoms down the right leg. She's been prescribed and taking Neurontin and meloxicam  for this as well as occasional Flexeril . She also has low vitamin D and some reflux history on medications. Over the past 6 months she's had some significant stress, her father had terminal cancer and was on hospice and died at home. Around that time she noted she was becoming short of breath doing certain activities. In fact minimal activity such as cleaning house or washing dishes was causing her to become short of breath. She did have one episode of chest discomfort which was significant and seem to last for weeks. She does not have any chest pain associated with exertion or relieved by rest. She does get short of breath just walking up stairs. She has no history of hypertension, diabetes, smoking or alcohol or drug use. Family history significant for heart disease in her grandmother but no significant first-degree relative with heart disease  04/09/2024  Raven Smith is seen today as a new patient.  I actually saw her last in 2016.  She had similar complaints at the time including chest pain, palpitations and some dyspnea on exertion.  She was under a lot of stress as her father was in hospice.  Recently she has been under more stress as her daughter had had strokes after being in a car accident.  She is currently caring for her.  She describes episodes of palpitation that occur at rest or can be with exertion.  They may happen up to several times a week and feels like her heart is racing.  She also is describing left-sided chest pain.  She describes some worsening tightness with exertion but can be at rest as well.  She gets intermittent  severe left chest pain episodes which are sharp and focal to one area that she can point to and seems to be worse when laying in certain positions.  These do not sound like angina.  She also reports she has had worsening fatigue.  Her sleep is disturbed at night.  Sometimes she wakes up gasping for breath and has been told by her husband that she snores.  There is daytime fatigue.  Possible risk factors for sleep apnea.  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   Hyperlipidemia 2014   rx'd zocor ; didn't do   Irregular menstrual bleeding 04/23/2013    Past Surgical History:  Procedure Laterality Date   BIOPSY  02/09/2020   Procedure: BIOPSY;  Surgeon: Cindie Carlin POUR, DO;  [facility]: [facility];  Service: Endoscopy;;   CHOLECYSTECTOMY     COLONOSCOPY WITH PROPOFOL  N/A 02/09/2020   Procedure: COLONOSCOPY WITH PROPOFOL ;  Surgeon: Cindie Carlin POUR, DO;  Location: [facility];  Service: Endoscopy;  Laterality: N/A;  8:00am   ESOPHAGOGASTRODUODENOSCOPY (EGD) WITH PROPOFOL  N/A 02/09/2020   Procedure: ESOPHAGOGASTRODUODENOSCOPY (EGD) WITH PROPOFOL ;  Surgeon: Cindie Carlin POUR, DO;  Location: [facility];  Service: Endoscopy;  Laterality: N/A;   TUBAL LIGATION      FAMHx:  Family History  Problem Relation Age of Onset   Fibroids Mother    Diabetes  Mother    Diabetes Father    Stroke Daughter        X 3   Breast cancer Maternal Grandmother    Diabetes Maternal Grandmother    Heart failure Maternal Grandmother    Other Son        MVA   Colon cancer Neg Hx    Gastric cancer Neg Hx     SOCHx:   reports that she has never smoked. She has never used smokeless tobacco. She reports current alcohol use. She reports that she does not use drugs.  ALLERGIES:  No Known Allergies  ROS: Pertinent items noted in HPI and remainder of comprehensive ROS otherwise negative.   HOME MEDS: Current  Outpatient Medications  Medication Sig Dispense Refill   acetaminophen  (TYLENOL ) 500 MG tablet Take 500 mg by mouth every 6 (six) hours as needed.     amoxicillin -clavulanate (AUGMENTIN ) 875-125 MG tablet Take 1 tablet by mouth every 12 (twelve) hours. 14 tablet 0   cyclobenzaprine  (FLEXERIL ) 10 MG tablet Take 10 mg by mouth 3 (three) times daily as needed for muscle spasms.     fluticasone  (FLONASE ) 50 MCG/ACT nasal spray Place 2 sprays into both nostrils daily. 16 g 0   loratadine (CLARITIN) 10 MG tablet Take 10 mg by mouth daily as needed for allergies.     megestrol  (MEGACE ) 40 MG tablet Take 1 tablet (40 mg total) by mouth 2 (two) times daily. 60 tablet 2   promethazine -dextromethorphan (PROMETHAZINE -DM) 6.25-15 MG/5ML syrup Take 5 mLs by mouth 4 (four) times daily as needed. 118 mL 0   No current facility-administered medications for this visit.    LABS/IMAGING: No results found for this or any previous visit (from the past 48 hours). No results found.  VITALS: BP 127/82   Pulse 88   Ht 5' 9 (1.753 m)   Wt 176 lb 9.6 oz (80.1 kg)   SpO2 99%   BMI 26.08 kg/m   EXAM: General appearance: alert and no distress Neck: no carotid bruit and no JVD Lungs: clear to auscultation bilaterally Heart: regular rate and rhythm, S1, S2 normal, no murmur, click, rub or gallop Abdomen: soft, non-tender; bowel sounds normal; no masses,  no organomegaly Extremities: extremities normal, atraumatic, no cyanosis or edema Pulses: 2+ and symmetric Skin: Skin color, texture, turgor normal. No rashes or lesions Neurologic: Grossly normal Psych: Pleasant, does not appear anxious  EKG: EKG Interpretation Date/Time:  Wednesday April 09 2024 10:21:49 EST Ventricular Rate:  88 PR Interval:  146 QRS Duration:  72 QT Interval:  348 QTC Calculation: 421 R Axis:   45  Text Interpretation: Normal sinus rhythm Low voltage QRS When compared with ECG of 17-May-2022 08:56, Compared to previous tracing  the R wave progression across the precordium is normal Confirmed by Mona Kent [phone]) on 04/09/2024 10:30:06 AM    ASSESSMENT: Shortness of breath with exertion Atypical chest pain Occasional palpitations At risk for sleep apnea -stop-bang score of 4  PLAN: 1.   Mrs. Schussler has a bevy of complaints today.  It has been about 10 years since I last saw her.  She actually had some similar complaints then.  She now is describing some chest pain which has some typical and atypical features however she has few cardiac risk factors including no history of hypertension or diabetes and no early onset heart disease in the family.  She also had a CT pulmonary angiogram to rule out PE back in 2024.  This did not  show any evidence of coronary calcium or atherosclerosis.  I will recommend a plain GXT exercise test.  In addition we will place a 2-week ZIO monitor to try to pick up and palpitations.  I also have some concern for sleep disturbance and apnea which could be contributing to palpitations.  Her STOP-BANG score is 4.  Will arrange for home sleep study.  Plan follow-up in about 2 months to review the studies  Vinie KYM Maxcy, MD, [facility], FNLA, FACP  [place]  [facility]  Medical Director of the Advanced Lipid Disorders &  Cardiovascular Risk Reduction Clinic Diplomate of the American Board of Clinical Lipidology Attending Cardiologist  Direct Dial: [phone]  Fax: [phone]  Website:  [email or web address]   Vinie BROCKS Margree Gimbel 04/09/2024, 10:30 AM

## 2024-04-09 NOTE — Progress Notes (Unsigned)
 Enrolled patient for a 14 day Zio XT  monitor to be mailed to patients home

## 2024-04-25 ENCOUNTER — Telehealth (HOSPITAL_COMMUNITY): Payer: Self-pay | Admitting: *Deleted

## 2024-04-25 ENCOUNTER — Ambulatory Visit (HOSPITAL_COMMUNITY)
Admission: RE | Admit: 2024-04-25 | Discharge: 2024-04-25 | Disposition: A | Source: Ambulatory Visit | Attending: Adult Health | Admitting: Adult Health

## 2024-04-25 ENCOUNTER — Other Ambulatory Visit (HOSPITAL_COMMUNITY)

## 2024-04-25 DIAGNOSIS — N921 Excessive and frequent menstruation with irregular cycle: Secondary | ICD-10-CM

## 2024-04-25 NOTE — Telephone Encounter (Signed)
 Reminder call with instructions given for upcoming GXT on 05/05/24 at 2:30

## 2024-05-05 ENCOUNTER — Ambulatory Visit (HOSPITAL_COMMUNITY)

## 2024-05-12 ENCOUNTER — Telehealth: Payer: Self-pay | Admitting: Adult Health

## 2024-05-12 NOTE — Telephone Encounter (Signed)
 Pt returned call and results were given. Call transferred to [facility] to schedule endo biopsy. JSY

## 2024-05-12 NOTE — Telephone Encounter (Signed)
 Patient returning call to us  please call back.

## 2024-05-12 NOTE — Telephone Encounter (Signed)
 Mail box full @ 10:58 am. JSY

## 2024-05-12 NOTE — Telephone Encounter (Signed)
 Pt aware of US  results. Pt is having some bleeding even with Megace . Pt was advised since she is still bleeding despite taking Megace , will need a endometrial biopsy. Pt voiced understanding and call was transferred to [facility] for appt. JSY

## 2024-05-12 NOTE — Telephone Encounter (Signed)
-----   Message from Delon Lewis, NP sent at 05/12/2024  8:34 AM EST ----- Call pt and let her know about US  and if bleeding still, on megace  to get endometrial biopsy. THX

## 2024-05-22 ENCOUNTER — Encounter (HOSPITAL_COMMUNITY): Payer: Self-pay | Admitting: *Deleted

## 2024-05-23 ENCOUNTER — Encounter: Payer: Self-pay | Admitting: Obstetrics & Gynecology

## 2024-05-23 ENCOUNTER — Other Ambulatory Visit (HOSPITAL_COMMUNITY)
Admission: RE | Admit: 2024-05-23 | Discharge: 2024-05-23 | Disposition: A | Discharge status: Home / Self Care | Source: Ambulatory Visit | Attending: Obstetrics & Gynecology | Admitting: Obstetrics & Gynecology

## 2024-05-23 ENCOUNTER — Ambulatory Visit: Admitting: Obstetrics & Gynecology

## 2024-05-23 VITALS — BP 127/75 | HR 86 | Ht 69.0 in | Wt 176.4 lb

## 2024-05-23 DIAGNOSIS — N939 Abnormal uterine and vaginal bleeding, unspecified: Secondary | ICD-10-CM | POA: Diagnosis present

## 2024-05-23 DIAGNOSIS — R300 Dysuria: Secondary | ICD-10-CM | POA: Diagnosis not present

## 2024-05-23 NOTE — Progress Notes (Signed)
 "  GYN VISIT Patient name: Raven Smith MRN [id number]  Date of birth: August 15, 1970 Chief Complaint:   Biopsy  History of Present Illness:   Raven Smith is a 54 y.o. [phone] perimenopausal female being seen today for the following concern.     AUB: Notes long standing h/o of HMB- prior to Megace  menses were typically 5-8 days Pt has been on megace  to help regulate her period at least for 1-2 years. While she mostly does not have a period with this medication will have some breakthrough bleeding.  This bleeding can be very irregular- mostly light bleeding may stop for 4-5 days before it returns.  Sometimes it can be every other week, sometimes the bleeding is more spaced out.  This month- was a bit better would have about 2 weeks of no bleeding then dark spotting.  +dysmenorrhea- does not take medication  Denies vaginal discharge itching or irritation.  She does note some dysuria and is concerned about a urinary tract infection.   Ultrasound completed 04/25/2024: 8.4 x 4.7 x 5.4cm uterus.  Endometrium measuring 5 mm.  Normal ovaries bilaterally   Review of Systems:   Pertinent items are noted in HPI Denies fever/chills, dizziness, headaches, visual disturbances, fatigue, shortness of breath, chest pain Pertinent History Reviewed:   Past Surgical History:  Procedure Laterality Date   BIOPSY  02/09/2020   Procedure: BIOPSY;  Surgeon: Cindie Carlin POUR, DO;  Location: [facility];  Service: Endoscopy;;   CHOLECYSTECTOMY     COLONOSCOPY WITH PROPOFOL  N/A 02/09/2020   Procedure: COLONOSCOPY WITH PROPOFOL ;  Surgeon: Cindie Carlin POUR, DO;  Location: [facility];  Service: Endoscopy;  Laterality: N/A;  8:00am   ESOPHAGOGASTRODUODENOSCOPY (EGD) WITH PROPOFOL  N/A 02/09/2020   Procedure: ESOPHAGOGASTRODUODENOSCOPY (EGD) WITH PROPOFOL ;  Surgeon: Cindie Carlin POUR, DO;  Location: [facility];  Service: Endoscopy;  Laterality: N/A;   TUBAL LIGATION      Past Medical History:  Diagnosis  Date   Anemia    Arthritis    Chronic back pain    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 Physical Assessment:   Vitals:   05/23/24 1005  BP: 127/75  Pulse: 86  Weight: 176 lb 6.4 oz (80 kg)  Height: 5' 9 (1.753 m)  Body mass index is 26.05 kg/m.       Physical Examination:   General appearance: alert, well appearing, and in no distress  Psych: mood appropriate, normal affect  Skin: warm & dry   Cardiovascular: normal heart rate noted  Respiratory: normal respiratory effort, no distress  Abdomen: soft, non-tender, no rebound or guarding  Pelvic: VULVA: normal appearing vulva with no masses, tenderness or lesions, VAGINA: normal appearing vagina with normal color and discharge, no lesions, CERVIX: normal appearing cervix without discharge or lesions, UTERUS: uterus is normal size, shape, consistency and nontender  Extremities: no edema   Chaperone: Aleck Blase    Endometrial Biopsy Procedure Note  Pre-operative Diagnosis: AUB  Post-operative Diagnosis: same  Procedure Details   The risks (including infection, bleeding, pain, and uterine perforation) and benefits of the procedure were explained to the patient and Written informed consent was obtained.  Antibiotic prophylaxis against endocarditis was not indicated.   The patient was placed in the dorsal lithotomy position.  Bimanual exam showed the uterus to be in the neutral position.  A speculum inserted in  the vagina, and the cervix prepped with betadine.     A single tooth tenaculum was applied to the anterior lip of the cervix for stabilization.  Os finder was used.  A Pipelle endometrial aspirator was used to sample the endometrium.  Sample was sent for pathologic examination.  Condition: Stable  Complications: None  Assessment & Plan:  1) AUB - Discussed that while  Megace  seems to be working okay for her at this time, this is not an ideal long-term management option - Discussed alternatives including Mirena, endometrial ablation or hysterectomy.  Reviewed risk benefit and recovery of each option.  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 She was advised to use OTC analgesics as needed for mild to moderate pain. She was advised to avoid vaginal intercourse for 48 hours or until the bleeding has completely stopped.  - For now plan to continue with Megace   2) Dysuria - UA and culture to be sent to rule out underlying etiology   Orders Placed This Encounter  Procedures   Urine Culture    No follow-ups on file.   Mccade Sullenberger, DO Attending Obstetrician & Gynecologist, [facility] for [facility], [facility]    "

## 2024-05-26 ENCOUNTER — Ambulatory Visit: Payer: Self-pay | Admitting: Obstetrics & Gynecology

## 2024-05-26 DIAGNOSIS — N3000 Acute cystitis without hematuria: Secondary | ICD-10-CM

## 2024-05-27 ENCOUNTER — Ambulatory Visit (HOSPITAL_COMMUNITY): Admission: RE | Admit: 2024-05-27

## 2024-05-27 LAB — SURGICAL PATHOLOGY

## 2024-05-27 MED ORDER — SULFAMETHOXAZOLE-TRIMETHOPRIM 800-160 MG PO TABS: 1.0000 | ORAL_TABLET | Freq: Two times a day (BID) | ORAL | 0 refills | Status: AC

## 2024-05-27 NOTE — Addendum Note (Signed)
 Addended by: MONA VINIE BROCKS on: 05/27/2024 04:29 PM   Modules accepted: Orders

## 2024-05-27 NOTE — Addendum Note (Signed)
 Addended by: JANIT GENI CROME on: 05/27/2024 03:23 PM   Modules accepted: Orders

## 2024-05-28 ENCOUNTER — Ambulatory Visit (HOSPITAL_COMMUNITY)
Admission: RE | Admit: 2024-05-28 | Discharge: 2024-05-28 | Disposition: A | Discharge status: Home / Self Care | Source: Ambulatory Visit | Attending: Cardiology | Admitting: Cardiology

## 2024-05-28 DIAGNOSIS — R079 Chest pain, unspecified: Secondary | ICD-10-CM | POA: Insufficient documentation

## 2024-05-28 LAB — URINE CULTURE

## 2024-05-28 LAB — EXERCISE TOLERANCE TEST
Angina Index: 1
Estimated workload: 5.2
Exercise duration (min): 3 min
Exercise duration (sec): 32 s
MPHR: 169 {beats}/min
Peak HR: 169 {beats}/min
Percent HR: 100 %
Rest HR: 115 {beats}/min

## 2024-06-02 ENCOUNTER — Ambulatory Visit: Payer: Self-pay | Admitting: Internal Medicine

## 2024-06-16 ENCOUNTER — Other Ambulatory Visit: Payer: Self-pay | Admitting: Adult Health

## 2024-07-16 ENCOUNTER — Ambulatory Visit: Admitting: Internal Medicine
